# Patient Record
Sex: Male | Born: 1950 | Race: Black or African American | Hispanic: No | Marital: Married | Smoking: Never smoker
Health system: Southern US, Community
[De-identification: ages and names within clinical notes are randomized; demographics above are authoritative.]

## PROBLEM LIST (undated history)

## (undated) DIAGNOSIS — I10 Essential (primary) hypertension: Secondary | ICD-10-CM

## (undated) DIAGNOSIS — R0789 Other chest pain: Secondary | ICD-10-CM

## (undated) DIAGNOSIS — R9431 Abnormal electrocardiogram [ECG] [EKG]: Secondary | ICD-10-CM

## (undated) DIAGNOSIS — R011 Cardiac murmur, unspecified: Secondary | ICD-10-CM

## (undated) DIAGNOSIS — C801 Malignant (primary) neoplasm, unspecified: Secondary | ICD-10-CM

## (undated) DIAGNOSIS — E785 Hyperlipidemia, unspecified: Secondary | ICD-10-CM

## (undated) HISTORY — DX: Malignant (primary) neoplasm, unspecified: C80.1

## (undated) HISTORY — DX: Essential (primary) hypertension: I10

## (undated) HISTORY — DX: Cardiac murmur, unspecified: R01.1

## (undated) HISTORY — PX: EYE SURGERY: SHX253

## (undated) HISTORY — PX: COLONOSCOPY: SHX174

## (undated) HISTORY — PX: OTHER SURGICAL HISTORY: SHX169

## (undated) HISTORY — DX: Hyperlipidemia, unspecified: E78.5

---

## 2005-08-27 ENCOUNTER — Ambulatory Visit: Payer: Self-pay | Admitting: Occupational Therapy

## 2005-11-23 ENCOUNTER — Ambulatory Visit: Payer: Self-pay | Admitting: Nurse Practitioner

## 2006-09-21 DIAGNOSIS — C801 Malignant (primary) neoplasm, unspecified: Secondary | ICD-10-CM

## 2006-09-21 HISTORY — DX: Malignant (primary) neoplasm, unspecified: C80.1

## 2006-09-21 HISTORY — PX: OTHER SURGICAL HISTORY: SHX169

## 2006-09-27 ENCOUNTER — Ambulatory Visit: Payer: Self-pay | Admitting: Urology

## 2006-09-30 ENCOUNTER — Ambulatory Visit: Payer: Self-pay | Admitting: Urology

## 2006-10-18 ENCOUNTER — Ambulatory Visit: Payer: Self-pay | Admitting: Nurse Practitioner

## 2007-05-24 ENCOUNTER — Ambulatory Visit: Payer: Self-pay | Admitting: Unknown Physician Specialty

## 2011-04-22 HISTORY — PX: UMBILICAL HERNIA REPAIR: SHX196

## 2011-04-28 ENCOUNTER — Ambulatory Visit: Payer: Self-pay | Admitting: General Surgery

## 2011-11-12 ENCOUNTER — Ambulatory Visit: Payer: Self-pay | Admitting: General Surgery

## 2011-11-12 LAB — CREATININE, SERUM
Creatinine: 0.97 mg/dL (ref 0.60–1.30)
EGFR (African American): >60
EGFR (Non-African Amer.): >60

## 2011-11-12 LAB — BUN: BUN: 14 mg/dL (ref 7–18)

## 2011-11-18 ENCOUNTER — Ambulatory Visit: Payer: Self-pay | Admitting: General Surgery

## 2011-11-18 HISTORY — PX: HERNIA REPAIR: SHX51

## 2015-01-13 NOTE — Op Note (Signed)
PATIENT NAME:  BARTOW, ZYLSTRA MR#:  [id number] DATE OF BIRTH:  1950/11/01  DATE OF PROCEDURE:  11/18/2011  PREOPERATIVE DIAGNOSIS: Ventral hernia.   POSTOPERATIVE DIAGNOSIS: Ventral hernia.   OPERATIVE PROCEDURE: Repair of ventral hernia with 6.4 cm Proceed ventral patch.   OPERATING SURGEON: Wilford Bellow, MD   ANESTHESIA: General by LMA, Marcaine 0.5% 30 mL local infiltration.   ESTIMATED BLOOD LOSS: Minimal.   CLINICAL NOTE: This 64 year old male had undergone repair of a symptomatic umbilical hernia last year and recently developed a bulge just above the umbilicus. CT scan showed a single 3 cm fascial defect above the previously placed mesh at the umbilical level. He was admitted for elective repair. He received Kefzol prior to the procedure.   OPERATIVE NOTE: With the patient under adequate general anesthesia, the area was prepped with ChloraPrep and draped. Marcaine was infiltrated for postoperative analgesia. A vertical skin incision was made and carried down through the skin and subcutaneous tissue. The hernia sac was identified and opened. It contained only omentum. This was reduced and the hernia sac freed circumferentially. Initial plan was to leave this in place but it was not possible to free it from the area of the mesh at the umbilical area. The sac was subsequently excised. A 6.4 cm Proceed ventral patch was then smoothed into the intraperitoneal position. Multiple transfacial 0 Surgilon sutures were placed approximately 2.5 cm from the edge of the fascial defect through the fascia, through the mesh, and then back to the fascia to anchor it into position. The mesh was then incorporated in the sutures that were used to transversely close the fascial defect. The wound was closed in layers with 2-0 Vicryl to the adipose layer, 3-0 Vicryl to the subcutaneous layer, and a 4-0 Vicryl running subcuticular suture for the skin. Benzoin, Steri-Strips, Telfa, and Tegaderm dressing was then  applied.      The patient tolerated the procedure well and was taken to the recovery room in stable condition.   ____________________________ Siddh Bellow, MD jwb:drc D: 11/18/2011 16:14:49 ET T: 11/18/2011 17:03:12 ET JOB#: [id number]  cc: Bearl Bellow, MD, <Dictator> Eather Colas. Sharlett Iles, FNP-C JEFFREY Amedeo Kinsman MD ELECTRONICALLY SIGNED 11/21/2011 16:37

## 2016-10-05 ENCOUNTER — Other Ambulatory Visit (HOSPITAL_COMMUNITY): Payer: Self-pay | Admitting: Urology

## 2016-10-05 DIAGNOSIS — R972 Elevated prostate specific antigen [PSA]: Secondary | ICD-10-CM

## 2016-10-09 ENCOUNTER — Ambulatory Visit (HOSPITAL_COMMUNITY): Payer: Self-pay

## 2016-10-13 ENCOUNTER — Ambulatory Visit (HOSPITAL_COMMUNITY)
Admission: RE | Admit: 2016-10-13 | Discharge: 2016-10-13 | Disposition: A | Discharge status: Home / Self Care | Payer: Medicare Other | Source: Ambulatory Visit | Attending: Urology | Admitting: Urology

## 2016-10-13 DIAGNOSIS — K4090 Unilateral inguinal hernia, without obstruction or gangrene, not specified as recurrent: Secondary | ICD-10-CM | POA: Diagnosis not present

## 2016-10-13 DIAGNOSIS — K5730 Diverticulosis of large intestine without perforation or abscess without bleeding: Secondary | ICD-10-CM | POA: Insufficient documentation

## 2016-10-13 DIAGNOSIS — R972 Elevated prostate specific antigen [PSA]: Secondary | ICD-10-CM | POA: Insufficient documentation

## 2016-10-13 LAB — POCT I-STAT CREATININE: CREATININE: 1 mg/dL (ref 0.61–1.24)

## 2016-10-13 MED ORDER — GADOBENATE DIMEGLUMINE 529 MG/ML IV SOLN
20.0000 mL | Freq: Once | INTRAVENOUS | Status: AC | PRN
  Administered 2016-10-13: 19 mL via INTRAVENOUS

## 2016-10-13 MED ORDER — LIDOCAINE HCL 2 % EX GEL: 1.0000 "application " | Freq: Once | CUTANEOUS | Status: DC

## 2016-10-13 MED ORDER — LIDOCAINE HCL 2 % EX GEL
CUTANEOUS | Status: AC
  Filled 2016-10-13: qty 30

## 2016-10-19 ENCOUNTER — Encounter: Payer: Self-pay | Admitting: General Surgery

## 2016-10-28 ENCOUNTER — Inpatient Hospital Stay: Admission: RE | Admit: 2016-10-28 | Payer: Medicare Other | Source: Ambulatory Visit

## 2016-10-29 ENCOUNTER — Encounter: Payer: Self-pay | Admitting: General Surgery

## 2016-10-29 ENCOUNTER — Ambulatory Visit (INDEPENDENT_AMBULATORY_CARE_PROVIDER_SITE_OTHER): Payer: Self-pay | Admitting: General Surgery

## 2016-10-29 VITALS — BP 142/90 | HR 75 | Resp 14 | Ht 67.5 in | Wt 199.0 lb

## 2016-10-29 DIAGNOSIS — K4090 Unilateral inguinal hernia, without obstruction or gangrene, not specified as recurrent: Secondary | ICD-10-CM

## 2016-10-29 NOTE — H&P (Signed)
NAMEBOONE, CUEBAS NO.:  [id number]  MEDICAL RECORD NO.:  [id number]  LOCATION:                                 FACILITY:  PHYSICIAN:  Maryan Puls          DATE OF BIRTH:  September 15, 1951  DATE OF ADMISSION: DATE OF DISCHARGE:                            HISTORY AND PHYSICAL   SAME-DAY SURGERY:  November 03, 2016.  CHIEF COMPLAINT:  Rising PSA.  HISTORY OF PRESENT ILLNESS:  Mr. 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  Recently, PSA levels have begun to rise.  He had an ultrasound-guided biopsy December 16, 2015, which was negative for carcinoma.  PSA was noted to be 3.0 ng on September 30, 2016.  He subsequently underwent prostate MRI scan on October 13, 2016, which indicated a small PI-RADS category 3 lesion in the left base lateral peripheral zone.  The patient comes in now for UroNav MRI-guided prostate biopsy.  PAST MEDICAL HISTORY:  ALLERGIES:  NO DRUG ALLERGIES.  CURRENT MEDICATIONS:  Amlodipine and a cholesterol medication.  SURGICAL HISTORY:  Colonoscopy 2003.  SOCIAL HISTORY:  The patient denied tobacco use, consumes alcohol moderately.  FAMILY HISTORY:  The patient has a brother with lymphoma.  Mother died of stomach cancer.  No family history of prostate cancer.  PAST AND CURRENT MEDICAL CONDITIONS: 1. Hypertension. 2. Hypercholesterolemia.  REVIEW OF SYSTEMS:  The patient denies chest pain, shortness of breath, diabetes, stroke, or heart disease.  PHYSICAL EXAMINATION:  GENERAL:  Well-nourished Afro-American male, in no distress. HEENT:  Sclerae were clear. NECK:  Supple.  No palpable cervical adenopathy. LUNGS:  Clear to auscultation. CARDIOVASCULAR:  Regular rhythm and rate. Abdomen:  Soft, nontender abdomen. GU:  Uncircumcised testes, smooth, nontender, 18 mL size each. RECTAL:  Internal hemorrhoids.  No palpable rectal  masses. PROSTATE:  Some vessels not palpable. NEUROMUSCULAR:  Alert and oriented x3.  IMPRESSION: 1. Prostate cancer. 2. Rising PSA. 3. PI-RADS category 3 lesion in the left base lateral peripheral zone.  PLAN:  UroNav MRI-guided prostate biopsies.    ______________________________ Maryan Puls   ______________________________ Maryan Puls    MW/MEDQ  D:  10/29/2016  T:  10/29/2016  Job:  [id number]

## 2016-10-29 NOTE — Patient Instructions (Addendum)
The patient is aware to call back for any questions or concerns.  Inguinal Hernia, Adult Introduction An inguinal hernia is when fat or the intestines push through the area where the leg meets the lower belly (groin) and make a rounded lump (bulge). This condition happens over time. There are three types of inguinal hernias. These types include:  Hernias that can be pushed back into the belly (are reducible).  Hernias that cannot be pushed back into the belly (are incarcerated).  Hernias that cannot be pushed back into the belly and lose their blood supply (get strangulated). This type needs emergency surgery. Follow these instructions at home: Lifestyle  Drink enough fluid to keep your urine (pee) clear or pale yellow.  Eat plenty of fruits, vegetables, and whole grains. These have a lot of fiber. Talk with your doctor if you have questions.  Avoid lifting heavy objects.  Avoid standing for long periods of time.  Do not use tobacco products. These include cigarettes, chewing tobacco, or e-cigarettes. If you need help quitting, ask your doctor.  Try to stay at a healthy weight. General instructions  Do not try to force the hernia back in.  Watch your hernia for any changes in color or size. Let your doctor know if there are any changes.  Take over-the-counter and prescription medicines only as told by your doctor.  Keep all follow-up visits as told by your doctor. This is important. Contact a doctor if:  You have a fever.  You have new symptoms.  Your symptoms get worse. Get help right away if:  The area where the legs meets the lower belly has:  Pain that gets worse suddenly.  A bulge that gets bigger suddenly and does not go down.  A bulge that turns red or purple.  A bulge that is painful to the touch.  You are a man and your scrotum:  Suddenly feels painful.  Suddenly changes in size.  You feel sick to your stomach (nauseous) and this feeling does not go  away.  You throw up (vomit) and this keeps happening.  You feel your heart beating a lot more quickly than normal.  You cannot poop (have a bowel movement) or pass gas. This information is not intended to replace advice given to you by your health care provider. Make sure you discuss any questions you have with your health care provider. Document Released: 10/08/2006 Document Revised: 02/13/2016 Document Reviewed: 07/18/2014  2017 Elsevier

## 2016-10-29 NOTE — Progress Notes (Addendum)
Patient ID: Brett Crawford, male   DOB: 15-Mar-1951, 66 y.o.   MRN: [id number]  Chief Complaint  Patient presents with  . Hernia    HPI Brett Crawford is a 66 y.o. male.  Patient here today for an evaluation of a possible hernia.  Denies pain.  No nausea, vomiting, constipation or diarrhea noted. 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 He has a history of prostate cancer and is followed by Dr Maryan Puls. Hernia was seen on MRI prostate on 10-13-16.  HPI  Past Medical History:  Diagnosis Date  . Cancer [facility]) 2008   prostate  . Hyperlipidemia   . Hypertension   . Murmur     Past Surgical History:  Procedure Laterality Date  . COLONOSCOPY  2011 ?  Marland Kitchen HERNIA REPAIR  11/18/2011   ventral / Dr Bary Castilla  . high intesnty focused ultrasound ( HIFU)  2008  . UMBILICAL HERNIA REPAIR  04/2011   Dr Bary Castilla    Family History  Problem Relation Age of Onset  . Stroke Father     Social History Social History  Substance Use Topics  . Smoking status: Never Smoker  . Smokeless tobacco: Never Used  . Alcohol use Yes     Comment: occasionally    No Known Allergies  Current Outpatient Prescriptions  Medication Sig Dispense Refill  . amLODipine (NORVASC) 10 MG tablet Take 10 mg by mouth daily.    . finasteride (PROSCAR) 5 MG tablet Take 5 mg by mouth daily.    . naproxen sodium (ANAPROX) 220 MG tablet Take 440 mg by mouth daily as needed (pain).    . simvastatin (ZOCOR) 20 MG tablet Take 20 mg by mouth daily.     No current facility-administered medications for this visit.     Review of Systems Review of Systems  Constitutional: Negative.   Respiratory: Negative.   Cardiovascular: Negative.   Gastrointestinal: Negative for abdominal pain, constipation and diarrhea.    Blood pressure (!) 142/90, pulse 75, resp. rate 14, height 5' 7.5" (1.715 m), weight 199 lb (90.3 kg), SpO2 98 %.  Physical Exam Physical Exam  Constitutional: He is oriented to person, place, and time. He  appears well-developed and well-nourished.  HENT:  Mouth/Throat: Oropharynx is clear and moist.  Eyes: Conjunctivae are normal. No scleral icterus.  Neck: Neck supple.  Cardiovascular: Normal rate and regular rhythm.   Murmur heard. Pulmonary/Chest: Effort normal and breath sounds normal.  Abdominal: Soft. Normal appearance. A hernia is present. Hernia confirmed positive in the right inguinal area.    Lymphadenopathy:    He has no cervical adenopathy.  Neurological: He is alert and oriented to person, place, and time.  Skin: Skin is warm and dry.  Psychiatric: His behavior is normal.    Data Reviewed Prostate MRI dated 10/14/2016 reviewed. Indirect right inguinal hernia with adipose tissue.  Review of the 11/12/2011 CT scan obtained prior to repair of his epigastric/ventral hernia reported a 2.3 cm nodule of indeterminate origin in the right adrenal gland. We'll clarify if the patient has had any further follow-up in regards to this lesion and if not, arrange for MRI.  Assessment    Asymptomatic right inguinal hernia.    Plan    The patient has many activities plan for the spring, including the marriage of his daughter coming up in April. He is totally asymptomatic. Observation is reasonable at this time, with elective repair if he becomes symptomatic or once to be more proactive.  Hernia precautions and incarceration were discussed with the patient. If they develop symptoms of an incarcerated hernia, they were encouraged to seek prompt medical attention.  I review the options for observation versus prophylactic repair, and the literature showing either option is safe. If elective repair is undertaken prosthetic mesh would be utilized. The risk of infection was reviewed. The role of prosthetic mesh to minimize the risk of recurrence was reviewed.  Proper lifting techniques reviewed. The patient is aware to call back for any questions or concerns.   This information has been  scribed by Karie Fetch RN, BSN,BC.  Brett Crawford 10/29/2016, 12:18 PM  I spoke with the patient after he returned home, and he is not aware of any additional imaging studies done at other institutions to follow-up the suspected right adrenal lesion. I recommended an MRI to evaluate this. He'll consider this recommendation, and notify the office of how he would like to proceed.

## 2016-10-30 ENCOUNTER — Encounter
Admission: RE | Admit: 2016-10-30 | Discharge: 2016-10-30 | Disposition: A | Discharge status: Home / Self Care | Payer: Medicare Other | Source: Ambulatory Visit | Attending: Urology | Admitting: Urology

## 2016-10-30 NOTE — Patient Instructions (Signed)
  Your procedure is scheduled on: 11-03-16 Report to Same Day Surgery 2nd floor medical mall [facility] Entrance-take elevator on left to 2nd floor.  Check in with surgery information desk.) To find out your arrival time please call [phone] between 1PM - 3PM on 11-02-16  Remember: Instructions that are not followed completely may result in serious medical risk, up to and including death, or upon the discretion of your surgeon and anesthesiologist your surgery may need to be rescheduled.    _x___ 1. Do not eat food or drink liquids after midnight. No gum chewing or hard candies.     __x__ 2. No Alcohol for 24 hours before or after surgery.   __x__3. No Smoking for 24 prior to surgery.   ____  4. Bring all medications with you on the day of surgery if instructed.    __x__ 5. Notify your doctor if there is any change in your medical condition     (cold, fever, infections).     Do not wear jewelry, make-up, hairpins, clips or nail polish.  Do not wear lotions, powders, or perfumes. You may wear deodorant.  Do not shave 48 hours prior to surgery. Men may shave face and neck.  Do not bring valuables to the hospital.    [facility] is not responsible for any belongings or valuables.               Contacts, dentures or bridgework may not be worn into surgery.  Leave your suitcase in the car. After surgery it may be brought to your room.  For patients admitted to the hospital, discharge time is determined by your treatment team.   Patients discharged the day of surgery will not be allowed to drive home.  You will need someone to drive you home and stay with you the night of your procedure.    Please read over the following fact sheets that you were given:   [facility] Preparing for Surgery and or MRSA Information   _x___ Take these medicines the morning of surgery with A SIP OF WATER:    1. AMLODIPINE  2. FINASTERIDE  3. SIMVASTATIN  4.  5.  6.  ____Fleets enema or Magnesium  Citrate as directed.   ____ Use CHG Soap or sage wipes as directed on instruction sheet   ____ Use inhalers on the day of surgery and bring to hospital day of surgery  ____ Stop metformin 2 days prior to surgery    ____ Take 1/2 of usual insulin dose the night before surgery and none on the morning of surgery.   ____ Stop Aspirin, Coumadin, Pllavix ,Eliquis, Effient, or Pradaxa  x__ Stop Anti-inflammatories such as Advil, Aleve, Ibuprofen, Motrin, Naproxen,          Naprosyn, Goodies powders or aspirin products NOW- Ok to take Tylenol.   ____ Stop supplements until after surgery.    ____ Bring C-Pap to the hospital.

## 2016-11-03 ENCOUNTER — Encounter: Payer: Self-pay | Admitting: *Deleted

## 2016-11-03 ENCOUNTER — Ambulatory Visit
Admission: RE | Admit: 2016-11-03 | Discharge: 2016-11-03 | Disposition: A | Discharge status: Home / Self Care | Payer: Medicare Other | Source: Ambulatory Visit | Attending: Urology | Admitting: Urology

## 2016-11-03 ENCOUNTER — Ambulatory Visit: Payer: Medicare Other | Admitting: Anesthesiology

## 2016-11-03 ENCOUNTER — Encounter
Admission: RE | Disposition: A | Discharge status: Home / Self Care | Payer: Self-pay | Source: Ambulatory Visit | Attending: Urology

## 2016-11-03 DIAGNOSIS — C61 Malignant neoplasm of prostate: Secondary | ICD-10-CM | POA: Diagnosis present

## 2016-11-03 DIAGNOSIS — R9721 Rising PSA following treatment for malignant neoplasm of prostate: Secondary | ICD-10-CM | POA: Insufficient documentation

## 2016-11-03 DIAGNOSIS — E7800 Pure hypercholesterolemia, unspecified: Secondary | ICD-10-CM | POA: Insufficient documentation

## 2016-11-03 DIAGNOSIS — I10 Essential (primary) hypertension: Secondary | ICD-10-CM | POA: Insufficient documentation

## 2016-11-03 HISTORY — PX: PROSTATE BIOPSY: SHX241

## 2016-11-03 SURGERY — BIOPSY, PROSTATE
Anesthesia: General

## 2016-11-03 MED ORDER — SODIUM CHLORIDE 0.9 % IV SOLN
INTRAVENOUS | Status: DC
  Administered 2016-11-03 (×2): via INTRAVENOUS

## 2016-11-03 MED ORDER — FENTANYL CITRATE (PF) 100 MCG/2ML IJ SOLN
INTRAMUSCULAR | Status: DC | PRN
  Administered 2016-11-03: 100 ug via INTRAVENOUS

## 2016-11-03 MED ORDER — SUCCINYLCHOLINE CHLORIDE 20 MG/ML IJ SOLN
INTRAMUSCULAR | Status: DC | PRN
  Administered 2016-11-03: 100 mg via INTRAVENOUS

## 2016-11-03 MED ORDER — DEXAMETHASONE SODIUM PHOSPHATE 10 MG/ML IJ SOLN
INTRAMUSCULAR | Status: DC | PRN
  Administered 2016-11-03: 10 mg via INTRAVENOUS

## 2016-11-03 MED ORDER — DEXTROSE 5 % IV SOLN
INTRAVENOUS | Status: DC | PRN
  Administered 2016-11-03: 1 g via INTRAVENOUS

## 2016-11-03 MED ORDER — FAMOTIDINE 20 MG PO TABS
20.0000 mg | ORAL_TABLET | Freq: Once | ORAL | Status: AC
  Administered 2016-11-03: 20 mg via ORAL

## 2016-11-03 MED ORDER — LIDOCAINE HCL 2 % EX GEL
CUTANEOUS | Status: AC
  Filled 2016-11-03: qty 10

## 2016-11-03 MED ORDER — PROPOFOL 10 MG/ML IV BOLUS
INTRAVENOUS | Status: DC | PRN
  Administered 2016-11-03: 200 mg via INTRAVENOUS

## 2016-11-03 MED ORDER — ROCURONIUM BROMIDE 100 MG/10ML IV SOLN
INTRAVENOUS | Status: DC | PRN
  Administered 2016-11-03: 5 mg via INTRAVENOUS

## 2016-11-03 MED ORDER — LEVOFLOXACIN 500 MG PO TABS: 500.0000 mg | ORAL_TABLET | Freq: Every day | ORAL | 0 refills | Status: DC

## 2016-11-03 MED ORDER — ESMOLOL HCL 100 MG/10ML IV SOLN
INTRAVENOUS | Status: DC | PRN
  Administered 2016-11-03: 30 mg via INTRAVENOUS

## 2016-11-03 MED ORDER — GENTAMICIN IN SALINE 1.6-0.9 MG/ML-% IV SOLN
80.0000 mg | Freq: Once | INTRAVENOUS | Status: AC
  Administered 2016-11-03: 80 mg via INTRAVENOUS
  Filled 2016-11-03: qty 50

## 2016-11-03 MED ORDER — LIDOCAINE 2% (20 MG/ML) 5 ML SYRINGE
INTRAMUSCULAR | Status: DC | PRN
  Administered 2016-11-03: 100 mg via INTRAVENOUS

## 2016-11-03 MED ORDER — DEXTROSE 5 % IV SOLN
1.0000 g | Freq: Once | INTRAVENOUS | Status: DC
  Filled 2016-11-03: qty 10

## 2016-11-03 MED ORDER — FENTANYL CITRATE (PF) 100 MCG/2ML IJ SOLN: 25.0000 ug | INTRAMUSCULAR | Status: DC | PRN

## 2016-11-03 MED ORDER — NUCYNTA 50 MG PO TABS: 50.0000 mg | ORAL_TABLET | Freq: Four times a day (QID) | ORAL | 0 refills | Status: DC | PRN

## 2016-11-03 MED ORDER — MIDAZOLAM HCL 5 MG/5ML IJ SOLN
INTRAMUSCULAR | Status: DC | PRN
  Administered 2016-11-03: 2 mg via INTRAVENOUS

## 2016-11-03 MED ORDER — MIDAZOLAM HCL 2 MG/2ML IJ SOLN
INTRAMUSCULAR | Status: AC
  Filled 2016-11-03: qty 2

## 2016-11-03 MED ORDER — FLEET ENEMA 7-19 GM/118ML RE ENEM: 1.0000 | ENEMA | Freq: Once | RECTAL | Status: DC

## 2016-11-03 MED ORDER — FAMOTIDINE 20 MG PO TABS
ORAL_TABLET | ORAL | Status: AC
  Filled 2016-11-03: qty 1

## 2016-11-03 MED ORDER — ONDANSETRON HCL 4 MG/2ML IJ SOLN: 4.0000 mg | Freq: Once | INTRAMUSCULAR | Status: DC | PRN

## 2016-11-03 MED ORDER — ONDANSETRON HCL 4 MG/2ML IJ SOLN
INTRAMUSCULAR | Status: DC | PRN
  Administered 2016-11-03: 4 mg via INTRAVENOUS

## 2016-11-03 MED ORDER — FENTANYL CITRATE (PF) 100 MCG/2ML IJ SOLN
INTRAMUSCULAR | Status: AC
  Filled 2016-11-03: qty 2

## 2016-11-03 MED ORDER — PROPOFOL 500 MG/50ML IV EMUL
INTRAVENOUS | Status: AC
  Filled 2016-11-03: qty 50

## 2016-11-03 MED ORDER — GLYCOPYRROLATE 0.2 MG/ML IJ SOLN
INTRAMUSCULAR | Status: AC
  Filled 2016-11-03: qty 1

## 2016-11-03 MED ORDER — GENTAMICIN SULFATE 40 MG/ML IJ SOLN
80.0000 mg | Freq: Once | INTRAVENOUS | Status: DC
  Filled 2016-11-03: qty 2

## 2016-11-03 SURGICAL SUPPLY — 27 items
BAG URO DRAIN 2000ML W/SPOUT (MISCELLANEOUS) ×3 IMPLANT
BIPLANE BIOPSY GUIDE ×2 IMPLANT
BLADE CLIPPER SURG (BLADE) IMPLANT
DRAPE SHEET LG 3/4 BI-LAMINATE (DRAPES) ×3 IMPLANT
DRESSING TELFA 4X3 1S ST N-ADH (GAUZE/BANDAGES/DRESSINGS) ×3 IMPLANT
DRSG TELFA 3X8 NADH (GAUZE/BANDAGES/DRESSINGS) ×3 IMPLANT
GAUZE SPONGE 4X4 12PLY STRL (GAUZE/BANDAGES/DRESSINGS) ×3 IMPLANT
GLOVE BIO SURGEON STRL SZ 6.5 (GLOVE) ×2 IMPLANT
GLOVE BIO SURGEON STRL SZ7 (GLOVE) ×6 IMPLANT
GLOVE BIO SURGEONS STRL SZ 6.5 (GLOVE) ×1
GUIDE NEEDLE ENDOCAV 16-18 CVR (NEEDLE) ×3 IMPLANT
INST BIOPSY MAXCORE 18GX25 (NEEDLE) ×3 IMPLANT
KIT RM TURNOVER CYSTO AR (KITS) ×3 IMPLANT
NDL DEFLUX 3.7X23X350 (MISCELLANEOUS) ×3
NDL SAFETY 18GX1.5 (NEEDLE) ×3 IMPLANT
NDL SAFETY 22GX1.5 (NEEDLE) ×3 IMPLANT
NEEDLE BIO TRUPATH DISP 18GX25 (MISCELLANEOUS) ×2 IMPLANT
NEEDLE DEFLUX 3.7X23X350 (MISCELLANEOUS) ×1 IMPLANT
NEEDLE GUIDE BIOPSY 644068 (NEEDLE) ×3 IMPLANT
PROBE ALOKA ALPHA6 ROB DROP IN (MISCELLANEOUS) ×2 IMPLANT
PROBE URONAV BK 8808E 8818 HLD (MISCELLANEOUS) ×1 IMPLANT
SYRINGE 10CC LL (SYRINGE) ×3 IMPLANT
SYRINGE IRR TOOMEY STRL 70CC (SYRINGE) ×3 IMPLANT
URONAV BK 8808E 8818 PROBE HLD (MISCELLANEOUS) ×3
URONAV MRI FUSION TWO PATIENTS (MISCELLANEOUS) ×2 IMPLANT
URONAV ULTRASOUND (MISCELLANEOUS) ×2 IMPLANT
WATER STERILE IRR 1000ML POUR (IV SOLUTION) ×3 IMPLANT

## 2016-11-03 NOTE — Discharge Instructions (Addendum)
AMBULATORY SURGERY  DISCHARGE INSTRUCTIONS   1) The drugs that you were given will stay in your system until tomorrow so for the next 24 hours you should not:  A) Drive an automobile B) Make any legal decisions C) Drink any alcoholic beverage   2) You may resume regular meals tomorrow.  Today it is better to start with liquids and gradually work up to solid foods.  You may eat anything you prefer, but it is better to start with liquids, then soup and crackers, and gradually work up to solid foods.   3) Please notify your doctor immediately if you have any unusual bleeding, trouble breathing, redness and pain at the surgery site, drainage, fever, or pain not relieved by medication. 4)   5) Your post-operative visit with Dr.                                     is: Date:                        Time:    Please call to schedule your post-operative visit.  6) Additional Instructions:      Transrectal Ultrasound-Guided Biopsy A transrectal ultrasound-guided biopsy is a procedure to remove samples of tissue from your prostate using ultrasound images to guide the procedure. The procedure is usually done to evaluate the prostate gland of men who have an elevated prostate-specific antigen (PSA). PSA is a blood test to screen for prostate cancer. The biopsy samples are taken to check for prostate cancer.  LET [facility] CARE PROVIDER KNOW ABOUT:  Any allergies you have.  All medicines you are taking, including vitamins, herbs, eye drops, creams, and over-the-counter medicines.  Previous problems you or members of your family have had with the use of anesthetics.  Any blood disorders you have.  Previous surgeries you have had.  Medical conditions you have. RISKS AND COMPLICATIONS Generally, this is a safe procedure. However, as with any procedure, problems can occur. Possible problems include:  Infection of your prostate.  Bleeding from your rectum or blood in your  urine.  Difficulty urinating.  Nerve damage (this is usually temporary).  Damage to surrounding structures such as blood vessels, organs, and muscles, which would require other procedures. BEFORE THE PROCEDURE  Do not  eat or drink anything after midnight on the night before the procedure or as directed by your health care provider.  Take medicines only as directed by your health care provider.  Your health care provider may have you stop taking certain medicines 5-7 days before the procedure.  You will be given an enema before the procedure. During an enema, a liquid is injected into your rectum to clear out waste.  You may have lab tests the day of your procedure.   Plan to have someone take you home after the procedure. PROCEDURE   You will be given medicine to help you relax (sedative) before the procedure. An IV tube will be inserted into one of your veins and used to give fluids and medicine.  You will be given antibiotic medicine to reduce the risk of an infection.  You will be placed on your side for the procedure.  A probe with lubricated gel will be placed into your rectum, and images will be taken of your prostate and surrounding structures.  Numbing medicine will be injected into the prostate before the  biopsy samples are taken.  A biopsy needle will then be inserted and guided to your prostate with the use of the ultrasound images.  Samples of prostate tissue will be taken, and the needle will then be removed.  The biopsy samples will be sent to a lab to be analyzed. Results are usually back in 2-3 days. AFTER THE PROCEDURE  You will be taken to a recovery area where you will be monitored.  You may have some discomfort in the rectal area. You will be given pain medicines to control this.  You may be allowed to go home the same day, or you may need to stay in the hospital overnight. This information is not intended to replace advice given to you by your health  care provider. Make sure you discuss any questions you have with your health care provider. Document Released: 01/22/2014 Document Revised: 09/28/2014 Document Reviewed: 01/22/2014 Elsevier Interactive Patient Education  2017 [place] Antigen Test Why am I having this test? The prostate-specific antigen (PSA) test is performed to determine how much PSA you have in your blood. PSA is a type of protein that is normally present in the prostate gland. Certain conditions can cause PSA blood levels to increase, such as:  Infection in the prostate (prostatitis).  Enlargement of the prostate (hypertrophy).  Prostate cancer. Because PSA levels increase greatly from prostate cancer, this test can be used to confirm a diagnosis of prostate cancer. It may also be used to monitor treatment for prostate cancer and to watch for a return of prostate cancer after treatment has finished. This test has a very high false-positive rate. Therefore, routine PSA screening for all men is no longer recommended. A false-positive result is incorrect because it indicates a condition or finding is present when it is not. What kind of sample is taken? A blood sample is required for this test. It is usually collected by inserting a needle into a vein or by sticking a finger with a small needle. How do I prepare for this test? There is no preparation required for this test. However, there are factors that can affect the results of a PSA test. To get the most accurate results:  Avoid having a rectal exam within several hours before having your blood drawn for this test.  Avoid having any procedures performed on the prostate gland within 6 weeks of having this test.  Avoid ejaculating within 24 hours of having this test.  Tell your health care provider if you had a recent urinary tract infection (UTI).  Tell your health care provider if you are taking medicines to assist with hair growth, such as  finasteride.  Tell your health care provider if you have been exposed to a medicine called diethylstilbestrol. Let your health care provider know if any of these factors apply to you. You may be asked to reschedule the test. What are the reference ranges? Reference ranges are established after testing a large group of people. Reference ranges may vary among different people, labs, and hospitals. It is your responsibility to obtain your test results. Ask the lab or department performing the test when and how you will get your results.  Low: 0-2.5 ng/mL.  Slightly to moderately elevated: 2.6-10.0 ng/mL.  Moderately elevated: 10.0-19.9 ng/mL.  Significantly elevated: 20 ng/mL or greater. What do the results mean? PSA test results greater than 4 ng/mL are found in the majority of men with prostate cancer. If your test result is above this level, this  can indicate an increased risk for prostate cancer. Increased PSA levels can also indicate other health conditions. Talk with your health care provider to discuss your results, treatment options, and if necessary, the need for more tests. Talk with your health care provider if you have any questions about your results. Talk with your health care provider to discuss your results, treatment options, and if necessary, the need for more tests. Talk with your health care provider if you have any questions about your results. This information is not intended to replace advice given to you by your health care provider. Make sure you discuss any questions you have with your health care provider. Document Released: 10/10/2004 Document Revised: 05/13/2016 Document Reviewed: 01/31/2014 Elsevier Interactive Patient Education  2017 [place] prostate is a walnut-sized gland that is involved in the production of semen. It is located below a man's bladder, in front of the rectum. Prostate cancer is the abnormal growth of cells in the prostate  gland. What are the causes? The exact cause of this condition is not known. What increases the risk? This condition is more likely to develop in men who:  Are older than age 31.  Are African-American.  Are obese.  Have a family history of prostate cancer.  Have a family history of breast cancer. What are the signs or symptoms? Symptoms of this condition include:  A need to urinate often.  Weak or interrupted flow of urine.  Trouble starting or stopping urination.  Inability to urinate.  Pain or burning during urination.  Painful ejaculation.  Blood in urine or semen.  Persistent pain or discomfort in the lower back, lower abdomen, hips, or upper thighs.  Trouble getting an erection.  Trouble emptying the bladder all the way. How is this diagnosed? This condition can be diagnosed with:  A digital rectal exam. For this exam, a health care provider inserts a gloved finger into the rectum to feel the prostate gland.  A blood test called a prostate-specific antigen (PSA) test.  An imaging test called transrectal ultrasonography.  A procedure in which a sample of tissue is taken from the prostate and examined under a microscope (prostate biopsy). Once the condition is diagnosed, tests will be done to determine how far the cancer has spread. This is called staging the cancer. Staging may involve imaging tests, such as:  A bone scan.  A CT scan.  A PET scan.  An MRI. The stages of prostate cancer are as follows:  Stage I. At this stage, the cancer is found in the prostate only. The cancer is not visible on imaging tests and it is usually found by accident, such as during a prostate surgery.  Stage II. At this stage, the cancer is more advanced than it is in stage I, but the cancer has not spread outside the prostate.  Stage III. At this stage, the cancer has spread beyond the outer layer of the prostate to nearby tissues. The cancer may be found in the seminal  vesicles, which are near the bladder and the prostate.  Stage IV. At this stage, the cancer has spread other parts of the body, such as the lymph nodes, bones, bladder, rectum, liver, or lungs. How is this treated? Treatment for this condition depends on several factors, including the stage of the cancer, your age, personal preferences, and your overall health. Talk with your health care provider about treatment options that are recommended for you. Common treatments include:  Observation for  early stage prostate cancer (active surveillance). This involves having exams, blood tests, and in some cases, more biopsies. For some men, this is the only treatment needed.  Surgery. Types of surgeries include:  Open surgery. In this surgery, a larger incision is made to remove the prostate.  A laparoscopic prostatectomy. This is a surgery to remove the prostate and lymph nodes through several, small incisions. It is often referred to as a minimally invasive surgery.  A robotic prostatectomy. This is a surgery to remove the prostate and lymph nodes with the help of a robotic arm that is controlled by a computer.  Orchiectomy. This is a surgery to remove the testicles.  Cryosurgery. This is a surgery to freeze and destroy cancer cells.  Radiation treatment. Types of radiation treatment include:  External beam radiation. This type aims beams of radiation from outside the body at the prostate to destroy cancerous cells.  Brachytherapy. This type uses radioactive needles, seeds, wires, or tubes that are implanted into the prostate gland. Like external beam radiation, brachytherapy destroys cancerous cells. An advantage is that this type of radiation limits the damage to surrounding tissue and has fewer side effects.  High-intensity, focused ultrasonography. This treatment destroys cancer cells by delivering high-energy ultrasound waves to the cancerous cells.  Chemotherapy medicines. This treatment  kills cancer cells or stops them from multiplying.  Hormone treatment. This treatment involves taking medicines that act on one of the male hormones (testosterone):  By stopping your body from producing testosterone.  By blocking testosterone from reaching cancer cells. Follow these instructions at home:  Take over-the-counter and prescription medicines only as told by your health care provider.  Maintain a healthy diet.  Get plenty of sleep.  Consider joining a support group for men who have prostate cancer. Meeting with a support group may help you learn to cope with the stress of having cancer.  Keep all follow-up visits as told by your health care provider. This is important.  If you have to go to the hospital, notify your cancer specialist (oncologist).  Treatment for prostate cancer may affect sexual function. Continue to have intimate moments with your partner. This may include touching, holding, hugging, and caressing. Contact a health care provider if:  You have trouble urinating.  You have blood in your urine.  You have pain in your hips, back, or chest. Get help right away if:  You have weakness or numbness in your legs.  You have cannot control urination or your bowel movements (incontinence).  You have trouble breathing.  You have sudden chest pain.  You have chills or a fever. Summary  The prostate is a walnut-sized gland that is involved in the production of semen. It is located below a man's bladder, in front of the rectum. Prostate cancer is the abnormal growth of cells in the prostate gland.  Treatment for this condition depends on several factors, including the stage of the cancer, your age, personal preferences, and your overall health. Talk with your health care provider about treatment options that are recommended for you.  Consider joining a support group for men who have prostate cancer. Meeting with a support group may help you learn to cope with  the stress of having cancer. This information is not intended to replace advice given to you by your health care provider. Make sure you discuss any questions you have with your health care provider. Document Released: 09/07/2005 Document Revised: 05/19/2016 Document Reviewed: 05/18/2016 Elsevier Interactive Patient Education  2017  Elsevier Inc[employer] ° °

## 2016-11-03 NOTE — Anesthesia Post-op Follow-up Note (Cosign Needed)
Anesthesia QCDR form completed.        

## 2016-11-03 NOTE — Op Note (Signed)
Preoperative diagnosis: 1. Rising PSA                                            2. Prostate cancer  Postoperative diagnosis: Same   Procedure: UroNAV guided transrectal fusion prostate biopsy    Surgeon: Otelia Limes. Yves Dill MD  Anesthesia: General  Indications:See the history and physical. After informed consent the above procedure(s) were requested     Technique and findings: After adequate general anesthesia been obtained the patient was placed into the lateral decubitus position. Finger sweep performed and rectal vault was free of debris. The transrectal ultrasound probe was then positioned and the prostate gland imaged in sagittal and transverse planes. The ultrasound images were then superimposed to the MRI images with the uroNav device. The region of interest in the peripheral left base was identified and 2 core biopsies obtained. At this point, standard sextant core biopsies were obtained using ultrasound guidance alone. The procedure was then terminated and patient transferred to the recovery room in stable condition.

## 2016-11-03 NOTE — H&P (Signed)
Date of Initial H&P: 10/29/16 History reviewed, patient examined, no change in status, stable for surgery.

## 2016-11-03 NOTE — Anesthesia Procedure Notes (Signed)
Procedure Name: Intubation Date/Time: 11/03/2016 1:13 PM Performed by: Marsh Dolly Pre-anesthesia Checklist: Patient identified, Patient being monitored, Timeout performed, Emergency Drugs available and Suction available Patient Re-evaluated:Patient Re-evaluated prior to inductionOxygen Delivery Method: Circle system utilized Preoxygenation: Pre-oxygenation with 100% oxygen Intubation Type: IV induction Ventilation: Mask ventilation without difficulty Laryngoscope Size: Mac and 3 Grade View: Grade I Tube type: Oral Tube size: 7.5 mm Number of attempts: 1 Placement Confirmation: ETT inserted through vocal cords under direct vision,  positive ETCO2 and breath sounds checked- equal and bilateral Secured at: 21 cm Tube secured with: Tape Dental Injury: Teeth and Oropharynx as per pre-operative assessment

## 2016-11-03 NOTE — Transfer of Care (Signed)
Immediate Anesthesia Transfer of Care Note  Patient: Brett Crawford  Procedure(s) Performed: Procedure(s): PROSTATE BIOPSY (N/A)  Patient Location: PACU  Anesthesia Type:General  Level of Consciousness: awake, alert  and oriented  Airway & Oxygen Therapy: Patient Spontanous Breathing and Patient connected to face mask oxygen  Post-op Assessment: Report given to RN and Post -op Vital signs reviewed and stable  Post vital signs: Reviewed and stable  Last Vitals:  Vitals:   11/03/16 1207 11/03/16 1356  BP: 129/82 (!) 134/91  Pulse: 65 76  Resp: 18 20  Temp: 36.8 C 36.2 C    Last Pain:  Vitals:   11/03/16 1207  TempSrc: Oral         Complications: No apparent anesthesia complications

## 2016-11-03 NOTE — Anesthesia Preprocedure Evaluation (Signed)
Anesthesia Evaluation  Patient identified by MRN, date of birth, ID band Patient awake    Reviewed: Allergy & Precautions  Airway Mallampati: II       Dental  (+) Teeth Intact   Pulmonary neg pulmonary ROS,    breath sounds clear to auscultation       Cardiovascular hypertension, Pt. on home beta blockers  Rhythm:Regular Rate:Normal     Neuro/Psych    GI/Hepatic negative GI ROS, Neg liver ROS,   Endo/Other  negative endocrine ROS  Renal/GU negative Renal ROS     Musculoskeletal   Abdominal Normal abdominal exam  (+)   Peds  Hematology negative hematology ROS (+)   Anesthesia Other Findings   Reproductive/Obstetrics                             Anesthesia Physical Anesthesia Plan  ASA: II  Anesthesia Plan: General   Post-op Pain Management:    Induction: Intravenous  Airway Management Planned: Oral ETT  Additional Equipment:   Intra-op Plan:   Post-operative Plan: Extubation in OR  Informed Consent: I have reviewed the patients History and Physical, chart, labs and discussed the procedure including the risks, benefits and alternatives for the proposed anesthesia with the patient or authorized representative who has indicated his/her understanding and acceptance.     Plan Discussed with: CRNA and Surgeon  Anesthesia Plan Comments:         Anesthesia Quick Evaluation

## 2016-11-04 ENCOUNTER — Encounter: Payer: Self-pay | Admitting: Urology

## 2016-11-04 NOTE — Addendum Note (Signed)
Encounter addended by: Nancie Neas Braya Habermehl on: 11/04/2016 11:32 AM<BR>    Actions taken: Imaging Exam ended, Charge Capture section accepted

## 2016-11-05 ENCOUNTER — Telehealth: Payer: Self-pay

## 2016-11-05 ENCOUNTER — Other Ambulatory Visit: Payer: Self-pay

## 2016-11-05 DIAGNOSIS — E279 Disorder of adrenal gland, unspecified: Principal | ICD-10-CM

## 2016-11-05 DIAGNOSIS — E278 Other specified disorders of adrenal gland: Secondary | ICD-10-CM

## 2016-11-05 LAB — SURGICAL PATHOLOGY

## 2016-11-05 NOTE — Telephone Encounter (Signed)
-----   Message from Montez Bellow, MD sent at 11/04/2016  1:26 PM EST ----- Regarding: RE: MRI Renal/ adrenal study: F/U adrenal nodule.  ----- Message ----- From: Lesly Rubenstein, LPN Sent: [id number]  11:22 AM To: Artice Bellow, MD Subject: MRI                                            Patient called and would like to set up the MRI as discussed at his last visit. Just need to know what you want ordered as far as the MRI, and diagnosis. Also do you want a follow up visit for the patient afterward ?

## 2016-11-05 NOTE — Telephone Encounter (Signed)
The patient is scheduled for an MRI Abdomen w/wo contrast at [facility] on 11/14/16 at 11:00 am. He is to arrive by 10:45 am and have nothing to eat or drink for 4 hours prior. The patient is aware of date, time, and instructions.

## 2016-11-09 ENCOUNTER — Encounter: Payer: Self-pay | Admitting: Urology

## 2016-11-14 ENCOUNTER — Ambulatory Visit
Admission: RE | Admit: 2016-11-14 | Discharge: 2016-11-14 | Disposition: A | Discharge status: Home / Self Care | Payer: Medicare Other | Source: Ambulatory Visit | Attending: General Surgery | Admitting: General Surgery

## 2016-11-14 DIAGNOSIS — N281 Cyst of kidney, acquired: Secondary | ICD-10-CM | POA: Diagnosis not present

## 2016-11-14 DIAGNOSIS — E279 Disorder of adrenal gland, unspecified: Secondary | ICD-10-CM | POA: Diagnosis present

## 2016-11-14 DIAGNOSIS — E278 Other specified disorders of adrenal gland: Secondary | ICD-10-CM

## 2016-11-14 DIAGNOSIS — K449 Diaphragmatic hernia without obstruction or gangrene: Secondary | ICD-10-CM | POA: Diagnosis not present

## 2016-11-14 MED ORDER — GADOBENATE DIMEGLUMINE 529 MG/ML IV SOLN
18.0000 mL | Freq: Once | INTRAVENOUS | Status: AC | PRN
  Administered 2016-11-14: 18 mL via INTRAVENOUS

## 2016-11-17 ENCOUNTER — Telehealth: Payer: Self-pay

## 2016-11-17 NOTE — Telephone Encounter (Signed)
Patient notified that he should receive a call back from Dr Bary Castilla regarding his MRI results tomorrow.

## 2016-11-19 ENCOUNTER — Telehealth: Payer: Self-pay

## 2016-11-19 NOTE — Telephone Encounter (Signed)
-----   Message from Foch Bellow, MD sent at 11/18/2016  8:40 PM EST ----- Please notify the patient the MRI was fine. No adrenal nodule. No additional testing needed.    ----- Message ----- From: Interface, Rad Results In Sent: 11/14/2016  12:14 PM To: Daren Bellow, MD

## 2016-11-19 NOTE — Telephone Encounter (Signed)
Notified patient as instructed, patient pleased. Discussed follow-up appointments, patient agrees. Will call with any problems with his hernia.

## 2017-02-02 ENCOUNTER — Ambulatory Visit: Payer: Medicare Other | Admitting: General Surgery

## 2017-02-02 ENCOUNTER — Ambulatory Visit (INDEPENDENT_AMBULATORY_CARE_PROVIDER_SITE_OTHER): Payer: Medicare Other | Admitting: General Surgery

## 2017-02-02 ENCOUNTER — Encounter: Payer: Self-pay | Admitting: General Surgery

## 2017-02-02 VITALS — BP 130/80 | HR 74 | Resp 14 | Ht 67.0 in | Wt 196.0 lb

## 2017-02-02 DIAGNOSIS — K4090 Unilateral inguinal hernia, without obstruction or gangrene, not specified as recurrent: Secondary | ICD-10-CM | POA: Diagnosis not present

## 2017-02-02 NOTE — Patient Instructions (Addendum)
Inguinal Hernia, Adult An inguinal hernia is when fat or the intestines push through the area where the leg meets the lower abdomen (groin) and create a rounded lump (bulge). This condition develops over time. There are three types of inguinal hernias. These types include:  Hernias that can be pushed back into the belly (are reducible).  Hernias that are not reducible (are incarcerated).  Hernias that are not reducible and lose their blood supply (are strangulated). This type of hernia requires emergency surgery. What are the causes? This condition is caused by having a weak spot in the muscles or tissue. This weakness lets the hernia poke through. This condition can be triggered by:  Suddenly straining the muscles of the lower abdomen.  Lifting heavy objects.  Straining to have a bowel movement. Difficult bowel movements (constipation) can lead to this.  Coughing. What increases the risk? This condition is more likely to develop in:  Men.  Pregnant women.  People who:  Are overweight.  Work in jobs that require long periods of standing or heavy lifting.  Have had an inguinal hernia before.  Smoke or have lung disease. These factors can lead to long-lasting (chronic) coughing. What are the signs or symptoms? Symptoms can depend on the size of the hernia. Often, a small inguinal hernia has no symptoms. Symptoms of a larger hernia include:  A lump in the groin. This is easier to see when the person is standing. It might not be visible when he or she is lying down.  Pain or burning in the groin. This occurs especially when lifting, straining, or coughing.  A dull ache or a feeling of pressure in the groin.  A lump in the scrotum in men. Symptoms of a strangulated inguinal hernia can include:  A bulge in the groin that is very painful and tender to the touch.  A bulge that turns red or purple.  Fever, nausea, and vomiting.  The inability to have a bowel movement or to  pass gas. How is this diagnosed? This condition is diagnosed with a medical history and physical exam. Your health care provider may feel your groin area and ask you to cough. How is this treated? Treatment for this condition varies depending on the size of your hernia and whether you have symptoms. If you do not have symptoms, your health care provider may have you watch your hernia carefully and come in for follow-up visits. If your hernia is larger or if you have symptoms, your treatment will include surgery. Follow these instructions at home: Lifestyle   Drink enough fluid to keep your urine clear or pale yellow.  Eat a diet that includes a lot of fiber. Eat plenty of fruits, vegetables, and whole grains. Talk with your health care provider if you have questions.  Avoid lifting heavy objects.  Avoid standing for long periods of time.  Do not use tobacco products, including cigarettes, chewing tobacco, or e-cigarettes. If you need help quitting, ask your health care provider.  Maintain a healthy weight. General instructions   Do not try to force the hernia back in.  Watch your hernia for any changes in color or size. Let your health care provider know if any changes occur.  Take over-the-counter and prescription medicines only as told by your health care provider.  Keep all follow-up visits as told by your health care provider. This is important. Contact a health care provider if:  You have a fever.  You have new symptoms.  Your   symptoms get worse. Get help right away if:  You have pain in the groin that suddenly gets worse.  A bulge in the groin gets bigger suddenly and does not go down.  You are a man and you have a sudden pain in the scrotum, or the size of your scrotum suddenly changes.  A bulge in the groin area becomes red or purple and is painful to the touch.  You have nausea or vomiting that does not go away.  You feel your heart beating a lot more quickly  than normal.  You cannot have a bowel movement or pass gas. This information is not intended to replace advice given to you by your health care provider. Make sure you discuss any questions you have with your health care provider. Document Released: 01/24/2009 Document Revised: 02/13/2016 Document Reviewed: 07/18/2014 Elsevier Interactive Patient Education  2017 [employer].   Patient to return as needed. The patient is aware to call back for any questions or concerns

## 2017-02-02 NOTE — Progress Notes (Signed)
Patient ID: Brett Crawford, male   DOB: Dec 13, 1950, 66 y.o.   MRN: [id number]  Chief Complaint  Patient presents with  . Follow-up    HPI Brett Crawford is a 66 y.o. male here today to discuss right inguinal hernia surgery. He states the area causes some discomfort while standing for extended periods of time such as when doing dishes. Otherwise asymptomatic during sporting events or strenuous activity.. Patient had a prostate biopsy done on  11/03/2016 The patient's daughter did get married last month as planned.    He's recently undergone treatment for prostate enlargement which required a catheter drainage for one week. No voiding difficulties at this time.  Marland KitchenHPI  Past Medical History:  Diagnosis Date  . Cancer [facility]) 2008   prostate  . Hyperlipidemia   . Hypertension   . Murmur    ASYMPTOMATIC PER PT    Past Surgical History:  Procedure Laterality Date  . COLONOSCOPY  2011 ?  Marland Kitchen HERNIA REPAIR  11/18/2011   ventral / Dr Bary Castilla  . high intesnty focused ultrasound ( HIFU)  2008  . PROSTATE BIOPSY N/A 11/03/2016   Procedure: PROSTATE BIOPSY;  Surgeon: Brett Cowper, MD;  Location: [facility] ORS;  Service: Urology;  Laterality: N/A;  . UMBILICAL HERNIA REPAIR  04/2011   Dr Bary Castilla    Family History  Problem Relation Age of Onset  . Stroke Father     Social History Social History  Substance Use Topics  . Smoking status: Never Smoker  . Smokeless tobacco: Never Used  . Alcohol use Yes     Comment: BEER OR WINE DAILY    No Known Allergies  Current Outpatient Prescriptions  Medication Sig Dispense Refill  . amLODipine (NORVASC) 10 MG tablet Take 10 mg by mouth every morning.     . finasteride (PROSCAR) 5 MG tablet Take 5 mg by mouth every morning.     Marland Kitchen levofloxacin (LEVAQUIN) 500 MG tablet Take 1 tablet (500 mg total) by mouth daily. 7 tablet 0  . naproxen sodium (ANAPROX) 220 MG tablet Take 440 mg by mouth daily as needed (pain).    . NUCYNTA 50 MG tablet Take 1 tablet (50  mg total) by mouth every 6 (six) hours as needed for moderate pain. 1 TO 2 TABS Q 6 HOURS PRN PAIN 30 tablet 0  . simvastatin (ZOCOR) 20 MG tablet Take 20 mg by mouth every morning.      No current facility-administered medications for this visit.     Review of Systems Review of Systems  Constitutional: Negative.   Respiratory: Negative.   Cardiovascular: Negative.     Blood pressure 130/80, pulse 74, resp. rate 14, height 5\' 7"  (1.702 m), weight 196 lb (88.9 kg).  Physical Exam Physical Exam  Constitutional: He is oriented to person, place, and time. He appears well-developed and well-nourished.  Eyes: Conjunctivae are normal. No scleral icterus.  Neck: Neck supple.  Cardiovascular: Normal rate, regular rhythm and normal heart sounds.   Pulmonary/Chest: Effort normal and breath sounds normal.  Abdominal: Soft. Normal appearance and bowel sounds are normal. There is no hepatomegaly. There is no tenderness. A hernia is present. Hernia confirmed positive in the right inguinal area (small right inguinal hernia).  Genitourinary:     Lymphadenopathy:    He has no cervical adenopathy.  Neurological: He is alert and oriented to person, place, and time.  Skin: Skin is warm and dry.    Data Reviewed MR of the adrenal completed thyroid 24,  2018 to follow-up, in a somewhat delayed fashion, the CT scan of the abdomen and pelvis dated 11/12/2011 showed no adrenal lesion. A small right renal cyst was identified.  Assessment    Small, minimally symptomatic right inguinal hernia.    Plan    Options for management reviewed: 1) sees early operative intervention versus 2) observation.  At this time the patient is going to see how he does and report if he becomes more symptomatic.    Patient to return as needed. The patient is aware to call back for any questions or concerns.   HPI, Physical Exam, Assessment and Plan have been scribed under the direction and in the presence of Hervey Ard, MD.  Gaspar Cola, CMA  I have completed the exam and reviewed the above documentation for accuracy and completeness.  I agree with the above.  Haematologist has been used and any errors in dictation or transcription are unintentional.  Hervey Ard, M.D., F.A.C.S.   Hernia precautions and incarceration were discussed with the patient. If they develop symptoms of an incarcerated hernia, they were encouraged to seek prompt medical attention.   Ankith Bellow 02/03/2017, 8:06 PM

## 2017-05-11 ENCOUNTER — Ambulatory Visit (INDEPENDENT_AMBULATORY_CARE_PROVIDER_SITE_OTHER): Payer: Medicare Other | Admitting: General Surgery

## 2017-05-11 VITALS — BP 132/74 | HR 74 | Resp 12 | Ht 67.0 in | Wt 190.0 lb

## 2017-05-11 DIAGNOSIS — K4090 Unilateral inguinal hernia, without obstruction or gangrene, not specified as recurrent: Secondary | ICD-10-CM | POA: Diagnosis not present

## 2017-05-11 NOTE — Progress Notes (Signed)
Patient ID: Brett Crawford, male   DOB: 1951/08/04, 66 y.o.   MRN: [id number]  Chief Complaint  Patient presents with  . Follow-up    HPI Brett Crawford is a 66 y.o. male here today re evaluation right inguinal hernia . Patient states he noticed some swelling about a month ago. Patient report being most symptomatic with long periods of standing.Marland Kitchen  HPI  Past Medical History:  Diagnosis Date  . Cancer [facility]) 2008   prostate  . Hyperlipidemia   . Hypertension   . Murmur    ASYMPTOMATIC PER PT    Past Surgical History:  Procedure Laterality Date  . COLONOSCOPY  2011 ?  Marland Kitchen HERNIA REPAIR  11/18/2011   ventral / Dr Bary Castilla  . high intesnty focused ultrasound ( HIFU)  2008  . PROSTATE BIOPSY N/A 11/03/2016   Procedure: PROSTATE BIOPSY;  Surgeon: Royston Cowper, MD;  Location: [facility] ORS;  Service: Urology;  Laterality: N/A;  . UMBILICAL HERNIA REPAIR  04/2011   Dr Bary Castilla    Family History  Problem Relation Age of Onset  . Stroke Father     Social History Social History  Substance Use Topics  . Smoking status: Never Smoker  . Smokeless tobacco: Never Used  . Alcohol use Yes     Comment: BEER OR WINE DAILY    No Known Allergies  Current Outpatient Prescriptions  Medication Sig Dispense Refill  . amLODipine (NORVASC) 10 MG tablet Take 10 mg by mouth every morning.     . finasteride (PROSCAR) 5 MG tablet Take 5 mg by mouth every morning.     Marland Kitchen levofloxacin (LEVAQUIN) 500 MG tablet Take 1 tablet (500 mg total) by mouth daily. 7 tablet 0  . naproxen sodium (ANAPROX) 220 MG tablet Take 440 mg by mouth daily as needed (pain).    . NUCYNTA 50 MG tablet Take 1 tablet (50 mg total) by mouth every 6 (six) hours as needed for moderate pain. 1 TO 2 TABS Q 6 HOURS PRN PAIN 30 tablet 0  . simvastatin (ZOCOR) 20 MG tablet Take 20 mg by mouth every morning.      No current facility-administered medications for this visit.     Review of Systems Review of Systems  Constitutional: Negative.    Respiratory: Negative.     Blood pressure 132/74, pulse 74, resp. rate 12, height 5\' 7"  (1.702 m), weight 190 lb (86.2 kg).  Physical Exam Physical Exam  Constitutional: He is oriented to person, place, and time. He appears well-developed and well-nourished.  Eyes: Conjunctivae are normal. No scleral icterus.  Neck: Neck supple.  Cardiovascular: Normal rate, regular rhythm and normal heart sounds.   Pulmonary/Chest: Effort normal and breath sounds normal.  Abdominal: Normal appearance. A hernia is present. Hernia confirmed positive in the right inguinal area.  Genitourinary:     Lymphadenopathy:    He has no cervical adenopathy.  Neurological: He is alert and oriented to person, place, and time.  Skin: Skin is warm and dry.    Data Reviewed No new data.  Assessment    Symptomatic right inguinal hernia.    Plan          Hernia precautions and incarceration were discussed with the patient. If they develop symptoms of an incarcerated hernia, they were encouraged to seek prompt medical attention.  I have recommended repair of the hernia using mesh on an outpatient basis in the near future. The risk of infection was reviewed. The role of prosthetic mesh  to minimize the risk of recurrence was reviewed.  HPI, Physical Exam, Assessment and Plan have been scribed under the direction and in the presence of Hervey Ard, MD.  Gaspar Cola, CMA  I have completed the exam and reviewed the above documentation for accuracy and completeness.  I agree with the above.  Haematologist has been used and any errors in dictation or transcription are unintentional.  Hervey Ard, M.D., F.A.C.S.  The patient is scheduled for surgery at [facility] on 05/25/17. He will pre admit by phone. The patient is aware of date and instructions.  Documented by Lesly Rubenstein LPN   Emeterio Bellow 05/11/2017, 9:29 PM

## 2017-05-11 NOTE — Patient Instructions (Addendum)

## 2017-05-14 ENCOUNTER — Encounter
Admission: RE | Admit: 2017-05-14 | Discharge: 2017-05-14 | Disposition: A | Discharge status: Home / Self Care | Payer: Medicare Other | Source: Ambulatory Visit | Attending: General Surgery | Admitting: General Surgery

## 2017-05-14 NOTE — Pre-Procedure Instructions (Signed)
Pt is out of time until Sunday Sept. 2, 2018.  Monday is a holiday, office will be closed.   Per pt request, called Dr, Ida Rogue office for copy of most recent labs.

## 2017-05-14 NOTE — Patient Instructions (Signed)
  Your procedure is scheduled on: Tuesday Sept. 4, 2018, 2018. Report to Same Day Surgery. To find out your arrival time please call [phone] between 1PM - 3PM on Friday May 21, 2017.  Remember: Instructions that are not followed completely may result in serious medical risk, up to and including death, or upon the discretion of your surgeon and anesthesiologist your surgery may need to be rescheduled.    _x___ 1. Do not eat food or drink liquids after midnight. No gum chewing or hard candies.     ____ 2. No Alcohol for 24 hours before or after surgery.   ____ 3. Bring all medications with you on the day of surgery if instructed.    __x__ 4. Notify your doctor if there is any change in your medical condition     (cold, fever, infections).    _____ 5. No smoking 24 hours prior to surgery.     Do not wear jewelry, make-up, hairpins, clips or nail polish.  Do not wear lotions, powders, or perfumes.   Do not shave 48 hours prior to surgery. Men may shave face and neck.  Do not bring valuables to the hospital.    [facility] is not responsible for any belongings or valuables.               Contacts, dentures or bridgework may not be worn into surgery.  Leave your suitcase in the car. After surgery it may be brought to your room.  For patients admitted to the hospital, discharge time is determined by your treatment team.   Patients discharged the day of surgery will not be allowed to drive home.    Please read over the following fact sheets that you were given:   [facility] Preparing for Surgery  __x__ Take these medicines the morning of surgery with A SIP OF WATER:    1. amLODipine (NORVASC)   2. simvastatin (ZOCOR)   ____ Fleet Enema (as directed)   ____ Use CHG Soap as directed on instruction sheet  ____ Use inhalers on the day of surgery and bring to hospital day of surgery  ____ Stop metformin 2 days prior to surgery    ____ Take 1/2 of usual insulin dose the  night before surgery and none on the morning of surgery.   ____ Stop Coumadin/Plavix/aspirin on does not apply.  _x___ Stop Anti-inflammatories such as Advil, Aleve, Ibuprofen, Motrin, Naproxen, Naprosyn, Goodies powders or aspirin  products. OK to take Tylenol.   ____ Stop supplements until after surgery.    ____ Bring C-Pap to the hospital.

## 2017-05-24 MED ORDER — CEFAZOLIN SODIUM-DEXTROSE 2-4 GM/100ML-% IV SOLN
2.0000 g | INTRAVENOUS | Status: AC
  Administered 2017-05-25: 2 g via INTRAVENOUS

## 2017-05-25 ENCOUNTER — Ambulatory Visit: Payer: Medicare Other | Admitting: Anesthesiology

## 2017-05-25 ENCOUNTER — Encounter
Admission: RE | Disposition: A | Discharge status: Home / Self Care | Payer: Self-pay | Source: Ambulatory Visit | Attending: General Surgery

## 2017-05-25 ENCOUNTER — Ambulatory Visit
Admission: RE | Admit: 2017-05-25 | Discharge: 2017-05-25 | Disposition: A | Discharge status: Home / Self Care | Payer: Medicare Other | Source: Ambulatory Visit | Attending: General Surgery | Admitting: General Surgery

## 2017-05-25 DIAGNOSIS — K4090 Unilateral inguinal hernia, without obstruction or gangrene, not specified as recurrent: Secondary | ICD-10-CM | POA: Insufficient documentation

## 2017-05-25 DIAGNOSIS — Z8546 Personal history of malignant neoplasm of prostate: Secondary | ICD-10-CM | POA: Diagnosis not present

## 2017-05-25 DIAGNOSIS — Z79899 Other long term (current) drug therapy: Secondary | ICD-10-CM | POA: Insufficient documentation

## 2017-05-25 DIAGNOSIS — I10 Essential (primary) hypertension: Secondary | ICD-10-CM | POA: Diagnosis not present

## 2017-05-25 HISTORY — PX: INGUINAL HERNIA REPAIR: SHX194

## 2017-05-25 SURGERY — REPAIR, HERNIA, INGUINAL, ADULT
Anesthesia: General | Laterality: Right | Wound class: Clean

## 2017-05-25 MED ORDER — DEXAMETHASONE SODIUM PHOSPHATE 10 MG/ML IJ SOLN
INTRAMUSCULAR | Status: AC
  Filled 2017-05-25: qty 1

## 2017-05-25 MED ORDER — DEXAMETHASONE SODIUM PHOSPHATE 10 MG/ML IJ SOLN
INTRAMUSCULAR | Status: DC | PRN
  Administered 2017-05-25: 4 mg via INTRAVENOUS

## 2017-05-25 MED ORDER — ACETAMINOPHEN 10 MG/ML IV SOLN
INTRAVENOUS | Status: AC
  Filled 2017-05-25: qty 100

## 2017-05-25 MED ORDER — HYDROCODONE-ACETAMINOPHEN 5-325 MG PO TABS: 1.0000 | ORAL_TABLET | ORAL | 0 refills | Status: DC | PRN

## 2017-05-25 MED ORDER — KETOROLAC TROMETHAMINE 30 MG/ML IJ SOLN
INTRAMUSCULAR | Status: AC
  Filled 2017-05-25: qty 1

## 2017-05-25 MED ORDER — HYDROCODONE-ACETAMINOPHEN 5-325 MG PO TABS
ORAL_TABLET | ORAL | Status: AC
  Filled 2017-05-25: qty 1

## 2017-05-25 MED ORDER — ONDANSETRON HCL 4 MG/2ML IJ SOLN: 4.0000 mg | Freq: Once | INTRAMUSCULAR | Status: DC | PRN

## 2017-05-25 MED ORDER — ONDANSETRON HCL 4 MG/2ML IJ SOLN
INTRAMUSCULAR | Status: DC | PRN
  Administered 2017-05-25: 4 mg via INTRAVENOUS

## 2017-05-25 MED ORDER — LIDOCAINE HCL (PF) 2 % IJ SOLN
INTRAMUSCULAR | Status: AC
  Filled 2017-05-25: qty 2

## 2017-05-25 MED ORDER — ONDANSETRON HCL 4 MG/2ML IJ SOLN
INTRAMUSCULAR | Status: AC
  Filled 2017-05-25: qty 2

## 2017-05-25 MED ORDER — GLYCOPYRROLATE 0.2 MG/ML IJ SOLN
INTRAMUSCULAR | Status: DC | PRN
  Administered 2017-05-25: 0.2 mg via INTRAVENOUS

## 2017-05-25 MED ORDER — BUPIVACAINE-EPINEPHRINE (PF) 0.5% -1:200000 IJ SOLN
INTRAMUSCULAR | Status: AC
  Filled 2017-05-25: qty 30

## 2017-05-25 MED ORDER — HYDROCODONE-ACETAMINOPHEN 5-325 MG PO TABS
1.0000 | ORAL_TABLET | ORAL | Status: DC | PRN
  Administered 2017-05-25: 1 via ORAL

## 2017-05-25 MED ORDER — BUPIVACAINE-EPINEPHRINE (PF) 0.5% -1:200000 IJ SOLN
INTRAMUSCULAR | Status: DC | PRN
  Administered 2017-05-25: 10 mL via PERINEURAL
  Administered 2017-05-25: 20 mL via PERINEURAL

## 2017-05-25 MED ORDER — FENTANYL CITRATE (PF) 100 MCG/2ML IJ SOLN: 25.0000 ug | INTRAMUSCULAR | Status: DC | PRN

## 2017-05-25 MED ORDER — FENTANYL CITRATE (PF) 100 MCG/2ML IJ SOLN
INTRAMUSCULAR | Status: DC | PRN
  Administered 2017-05-25 (×2): 25 ug via INTRAVENOUS
  Administered 2017-05-25: 50 ug via INTRAVENOUS

## 2017-05-25 MED ORDER — FENTANYL CITRATE (PF) 100 MCG/2ML IJ SOLN
INTRAMUSCULAR | Status: AC
  Filled 2017-05-25: qty 2

## 2017-05-25 MED ORDER — ACETAMINOPHEN 10 MG/ML IV SOLN
INTRAVENOUS | Status: DC | PRN
  Administered 2017-05-25: 1000 mg via INTRAVENOUS

## 2017-05-25 MED ORDER — PROPOFOL 10 MG/ML IV BOLUS
INTRAVENOUS | Status: AC
  Filled 2017-05-25: qty 20

## 2017-05-25 MED ORDER — LACTATED RINGERS IV SOLN
INTRAVENOUS | Status: DC
  Administered 2017-05-25: 07:00:00 via INTRAVENOUS

## 2017-05-25 MED ORDER — PROPOFOL 10 MG/ML IV BOLUS
INTRAVENOUS | Status: DC | PRN
  Administered 2017-05-25: 150 mg via INTRAVENOUS

## 2017-05-25 MED ORDER — FAMOTIDINE 20 MG PO TABS
ORAL_TABLET | ORAL | Status: AC
  Administered 2017-05-25: 20 mg via ORAL
  Filled 2017-05-25: qty 1

## 2017-05-25 MED ORDER — MIDAZOLAM HCL 2 MG/2ML IJ SOLN
INTRAMUSCULAR | Status: DC | PRN
  Administered 2017-05-25: 2 mg via INTRAVENOUS

## 2017-05-25 MED ORDER — FAMOTIDINE 20 MG PO TABS
20.0000 mg | ORAL_TABLET | Freq: Once | ORAL | Status: AC
  Administered 2017-05-25: 20 mg via ORAL

## 2017-05-25 MED ORDER — LIDOCAINE HCL (CARDIAC) 20 MG/ML IV SOLN
INTRAVENOUS | Status: DC | PRN
  Administered 2017-05-25: 50 mg via INTRAVENOUS

## 2017-05-25 MED ORDER — CEFAZOLIN SODIUM-DEXTROSE 2-4 GM/100ML-% IV SOLN
INTRAVENOUS | Status: AC
  Filled 2017-05-25: qty 100

## 2017-05-25 MED ORDER — PHENYLEPHRINE HCL 10 MG/ML IJ SOLN
INTRAMUSCULAR | Status: AC
  Filled 2017-05-25: qty 1

## 2017-05-25 MED ORDER — MIDAZOLAM HCL 2 MG/2ML IJ SOLN
INTRAMUSCULAR | Status: AC
  Filled 2017-05-25: qty 2

## 2017-05-25 MED ORDER — KETOROLAC TROMETHAMINE 30 MG/ML IJ SOLN
INTRAMUSCULAR | Status: DC | PRN
  Administered 2017-05-25: 30 mg

## 2017-05-25 SURGICAL SUPPLY — 34 items
BLADE SURG 15 STRL SS SAFETY (BLADE) ×6 IMPLANT
CANISTER SUCT 1200ML W/VALVE (MISCELLANEOUS) ×3 IMPLANT
CHLORAPREP W/TINT 26ML (MISCELLANEOUS) ×3 IMPLANT
CLOSURE WOUND 1/2 X4 (GAUZE/BANDAGES/DRESSINGS) ×1
DECANTER SPIKE VIAL GLASS SM (MISCELLANEOUS) IMPLANT
DRAIN PENROSE 1/4X12 LTX (DRAIN) ×3 IMPLANT
DRAPE LAPAROTOMY 100X77 ABD (DRAPES) ×3 IMPLANT
DRSG TEGADERM 4X4.75 (GAUZE/BANDAGES/DRESSINGS) ×3 IMPLANT
DRSG TELFA 4X3 1S NADH ST (GAUZE/BANDAGES/DRESSINGS) ×3 IMPLANT
ELECT REM PT RETURN 9FT ADLT (ELECTROSURGICAL) ×3
ELECTRODE REM PT RTRN 9FT ADLT (ELECTROSURGICAL) ×1 IMPLANT
GLOVE BIO SURGEON STRL SZ7.5 (GLOVE) ×6 IMPLANT
GLOVE INDICATOR 8.0 STRL GRN (GLOVE) ×6 IMPLANT
GOWN STRL REUS W/ TWL LRG LVL3 (GOWN DISPOSABLE) ×2 IMPLANT
GOWN STRL REUS W/TWL LRG LVL3 (GOWN DISPOSABLE) ×4
KIT RM TURNOVER STRD PROC AR (KITS) ×3 IMPLANT
LABEL OR SOLS (LABEL) ×3 IMPLANT
MESH HERNIA 6X12 ULTRAPRO MED (Mesh General) ×1 IMPLANT
MESH HERNIA ULTRAPRO MED (Mesh General) ×2 IMPLANT
NDL SAFETY 22GX1.5 (NEEDLE) ×6 IMPLANT
NEEDLE HYPO 25X1 1.5 SAFETY (NEEDLE) ×3 IMPLANT
PACK BASIN MINOR ARMC (MISCELLANEOUS) ×3 IMPLANT
STRIP CLOSURE SKIN 1/2X4 (GAUZE/BANDAGES/DRESSINGS) ×2 IMPLANT
SUT SURGILON 0 BLK (SUTURE) ×6 IMPLANT
SUT VIC AB 2-0 SH 27 (SUTURE) ×2
SUT VIC AB 2-0 SH 27XBRD (SUTURE) ×1 IMPLANT
SUT VIC AB 3-0 54X BRD REEL (SUTURE) ×1 IMPLANT
SUT VIC AB 3-0 BRD 54 (SUTURE) ×2
SUT VIC AB 3-0 SH 27 (SUTURE) ×2
SUT VIC AB 3-0 SH 27X BRD (SUTURE) ×1 IMPLANT
SUT VIC AB 4-0 FS2 27 (SUTURE) ×3 IMPLANT
SWABSTK COMLB BENZOIN TINCTURE (MISCELLANEOUS) ×3 IMPLANT
SYR 3ML LL SCALE MARK (SYRINGE) ×3 IMPLANT
SYR CONTROL 10ML (SYRINGE) ×6 IMPLANT

## 2017-05-25 NOTE — Anesthesia Postprocedure Evaluation (Signed)
Anesthesia Post Note  Patient: Brett Crawford  Procedure(s) Performed: Procedure(s) (LRB): HERNIA REPAIR INGUINAL ADULT (Right)  Patient location during evaluation: PACU Anesthesia Type: General Level of consciousness: awake and alert Pain management: pain level controlled Vital Signs Assessment: post-procedure vital signs reviewed and stable Respiratory status: spontaneous breathing, nonlabored ventilation, respiratory function stable and patient connected to nasal cannula oxygen Cardiovascular status: blood pressure returned to baseline and stable Postop Assessment: no signs of nausea or vomiting Anesthetic complications: no     Last Vitals:  Vitals:   05/25/17 0909 05/25/17 1006  BP: 131/83 124/71  Pulse: 68 72  Resp: 16 15  Temp: (!) 35.7 C   SpO2: 100% 99%    Last Pain:  Vitals:   05/25/17 1006  TempSrc:   PainSc: [place]

## 2017-05-25 NOTE — Op Note (Signed)
Preoperative diagnosis: Right inguinal hernia.  Postoperative diagnosis: Same.  Operative procedure: Repair of right indirect inguinal hernia with medium Ultra Pro mesh.  Operating surgeon: Ollen Bowl, M.D.  Anesthesia: Gen. By LMA, Marcaine 0.5% with 1-200,000 of epinephrine, 30 mL; Toradol 30 mg.  Estimated blood loss: Less than 5 mL.  Clinical note: This 66 year old male has developed a symptomatic right inguinal hernia and was ready for elective repair. Air was removed from the surgical site with clippers prior to presentation to the operating theater. The patient received 2 g prior to the procedure probiotic prophylaxis.  Operative note: With the patient under adequate general anesthesia the area was prepped with ChloraPrep and draped. Field block anesthesia was established with the above-mentioned local anesthesia. The skin was incised sharply and the remaining dissection completed with electrocautery. Hemostasis was with 3-0 Vicryl ties and cautery. The external blood per soap and the direction of its fibers. There is a moderate inflammatory response. The iliohypogastric nerve was identified and protected. The ilioinguinal nerve was not identified. The cord was mobilized and a sizable indirect sac was noted. This was freed circumferentially into the preperitoneal space. A medium Ultra Pro mesh was smoothed into the preperitoneal position and the external componentlaid along the floor the inguinal canal. The mesh was anchored to the pubic tubercle with 0 Surgilon suture. Interrupted 0 Surgilon sutures were used along the inguinal ligament. The medial and superior borders were anchored to the transverse abdominis aponeurosis. Toradol was placed in the wound. A lateral slit was made for cord passage. This was closed with interrupted sutures. The external Bleich was closed with a running 2-0 Vicryl suture. Scarpa's fascia was closed with a running 3-0 Vicryl suture. The skin closed with a  running 4-0 Vicryl subcuticular suture. Benzoin, Steri-Strips, Telfa and Tegaderm dressing applied.  The patient tolerated the procedure well and was taken to the recovery room in stable condition.

## 2017-05-25 NOTE — Transfer of Care (Signed)
Immediate Anesthesia Transfer of Care Note  Patient: Brett Crawford  Procedure(s) Performed: Procedure(s): HERNIA REPAIR INGUINAL ADULT (Right)  Patient Location: PACU  Anesthesia Type:General  Level of Consciousness: sedated  Airway & Oxygen Therapy: Patient Spontanous Breathing and Patient connected to face mask oxygen  Post-op Assessment: Report given to RN and Post -op Vital signs reviewed and stable  Post vital signs: Reviewed and stable  Last Vitals:  Vitals:   05/25/17 0613 05/25/17 0830  BP: 134/83 126/77  Pulse:  77  Resp: 17 13  Temp: 36.4 C 36.4 C  SpO2: 71% 85%    Complications: No apparent anesthesia complications

## 2017-05-25 NOTE — Anesthesia Post-op Follow-up Note (Signed)
Anesthesia QCDR form completed.        

## 2017-05-25 NOTE — Discharge Instructions (Signed)
AMBULATORY SURGERY  DISCHARGE INSTRUCTIONS   1) The drugs that you were given will stay in your system until tomorrow so for the next 24 hours you should not:  A) Drive an automobile B) Make any legal decisions C) Drink any alcoholic beverage   2) You may resume regular meals tomorrow.  Today it is better to start with liquids and gradually work up to solid foods.  You may eat anything you prefer, but it is better to start with liquids, then soup and crackers, and gradually work up to solid foods.   3) Please notify your doctor immediately if you have any unusual bleeding, trouble breathing, redness and pain at the surgery site, drainage, fever, or pain not relieved by medication.   4)  Additional Instructions: Drink plenty of liquids today.  Take the pain medication as needed.  Use stool softeners along with narcotic.  Take Tylenol and/or Ibuprofen in between doses to enhance the effects of the medicine.   Have something in your stomach prior to taking any meds.  Hold a pillow against your stomach/groin with moving, coughing or sitting/standing.     Please contact your physician with any problems or Same Day Surgery at [phone], Monday through Friday 6 am to 4 pm, or [place] at [facility] number at [phone].

## 2017-05-25 NOTE — OR Nursing (Signed)
Dr. Bary Castilla in to speak with patient.  Okayed for discharge.

## 2017-05-25 NOTE — Anesthesia Procedure Notes (Signed)
Procedure Name: LMA Insertion Performed by: Keamber Macfadden Pre-anesthesia Checklist: Patient identified, Patient being monitored, Timeout performed, Emergency Drugs available and Suction available Patient Re-evaluated:Patient Re-evaluated prior to induction Oxygen Delivery Method: Circle system utilized Preoxygenation: Pre-oxygenation with 100% oxygen Induction Type: IV induction LMA: LMA inserted LMA Size: 4.5 Tube type: Oral Number of attempts: 1 Placement Confirmation: positive ETCO2 and breath sounds checked- equal and bilateral Tube secured with: Tape Dental Injury: Teeth and Oropharynx as per pre-operative assessment        

## 2017-05-25 NOTE — H&P (Signed)
No change in clinical history or exam. For right inguinal hernia repair.  

## 2017-05-25 NOTE — Anesthesia Preprocedure Evaluation (Signed)
Anesthesia Evaluation  Patient identified by MRN, date of birth, ID band Patient awake    Reviewed: Allergy & Precautions, NPO status , Patient's Chart, lab work & pertinent test results, reviewed documented beta blocker date and time   Airway Mallampati: II  TM Distance: >3 FB     Dental  (+) Chipped   Pulmonary           Cardiovascular hypertension, Pt. on medications      Neuro/Psych    GI/Hepatic   Endo/Other    Renal/GU      Musculoskeletal   Abdominal   Peds  Hematology   Anesthesia Other Findings Prostate ca.  Reproductive/Obstetrics                             Anesthesia Physical Anesthesia Plan  ASA: III  Anesthesia Plan: General   Post-op Pain Management:    Induction: Intravenous  PONV Risk Score and Plan:   Airway Management Planned: LMA  Additional Equipment:   Intra-op Plan:   Post-operative Plan:   Informed Consent: I have reviewed the patients History and Physical, chart, labs and discussed the procedure including the risks, benefits and alternatives for the proposed anesthesia with the patient or authorized representative who has indicated his/her understanding and acceptance.     Plan Discussed with: CRNA  Anesthesia Plan Comments:         Anesthesia Quick Evaluation

## 2017-05-26 ENCOUNTER — Encounter: Payer: Self-pay | Admitting: General Surgery

## 2017-06-01 ENCOUNTER — Ambulatory Visit (INDEPENDENT_AMBULATORY_CARE_PROVIDER_SITE_OTHER): Payer: Medicare Other | Admitting: General Surgery

## 2017-06-01 ENCOUNTER — Encounter: Payer: Self-pay | Admitting: General Surgery

## 2017-06-01 VITALS — BP 120/82 | HR 72 | Resp 12 | Ht 67.0 in | Wt 194.0 lb

## 2017-06-01 DIAGNOSIS — K4090 Unilateral inguinal hernia, without obstruction or gangrene, not specified as recurrent: Secondary | ICD-10-CM

## 2017-06-01 NOTE — Patient Instructions (Addendum)
Patient to return as needed. The patient is aware to call back for any questions or concerns. Proper lifting techniques reviewed.  

## 2017-06-01 NOTE — Progress Notes (Signed)
Patient ID: Brett Crawford, male   DOB: [birth year], 66 y.o.   MRN: [id number]  Chief Complaint  Patient presents with  . Routine Post Op    HPI Brett Crawford is a 66 y.o. male here today for his post op right inguinal hernia repair done on 05/25/2017. Patient states he is doing well, but still a little sore. The patient required no narcotics. Rare use of Tylenol/Aleve.   lHPI  Past Medical History:  Diagnosis Date  . Cancer [facility]) 2008   prostate  . Hyperlipidemia   . Hypertension   . Murmur    ASYMPTOMATIC PER PT    Past Surgical History:  Procedure Laterality Date  . COLONOSCOPY  2011 ?  Marland Kitchen HERNIA REPAIR  11/18/2011   ventral / Dr Bary Castilla  . high intesnty focused ultrasound ( HIFU)  2008  . INGUINAL HERNIA REPAIR Right 05/25/2017   Indirect hernia repaired with medium Ultra Pro mesh  Surgeon: Eldin Bellow, MD;  Location: [facility] ORS;  Service: General;  Laterality: Right;  . PROSTATE BIOPSY N/A 11/03/2016   Procedure: PROSTATE BIOPSY;  Surgeon: Royston Cowper, MD;  Location: [facility] ORS;  Service: Urology;  Laterality: N/A;  . UMBILICAL HERNIA REPAIR  04/2011   Dr Bary Castilla    Family History  Problem Relation Age of Onset  . Stroke Father     Social History Social History  Substance Use Topics  . Smoking status: Never Smoker  . Smokeless tobacco: Never Used  . Alcohol use Yes     Comment: BEER OR WINE DAILY    No Known Allergies  Current Outpatient Prescriptions  Medication Sig Dispense Refill  . amLODipine (NORVASC) 10 MG tablet Take 10 mg by mouth every morning.     . finasteride (PROSCAR) 5 MG tablet Take 5 mg by mouth every morning.     . naproxen sodium (ANAPROX) 220 MG tablet Take 440 mg by mouth daily as needed (pain).    . simvastatin (ZOCOR) 20 MG tablet Take 20 mg by mouth every morning.      No current facility-administered medications for this visit.     Review of Systems Review of Systems  Constitutional: Negative.   Respiratory: Negative.    Cardiovascular: Negative.     Blood pressure 120/82, pulse 72, resp. rate 12, height 5\' 7"  (1.702 m), weight 194 lb (88 kg).  Physical Exam Physical Exam  Constitutional: He is oriented to person, place, and time. He appears well-developed and well-nourished.  Abdominal:  Right inguinal hernia repair intact and healing well.   Genitourinary:     Neurological: He is alert and oriented to person, place, and time.  Skin: Skin is warm and dry.        Assessment    Doing well status post repair of right indirect inguinal hernia.    Plan    Proper lifting technique reviewed. The patient may increase his activity as he is comfortable.    Patient to return as needed. The patient is aware to call back for any questions or concerns.   HPI, Physical Exam, Assessment and Plan have been scribed under the direction and in the presence of Hervey Ard, MD.  Gaspar Cola, CMA  Hernia precautions and incarceration were discussed with the patient. If they develop symptoms of an incarcerated hernia, they were encouraged to seek prompt medical attention.  I have recommended repair of the hernia using mesh on an outpatient basis in the near future. The risk of infection was reviewed.  The role of prosthetic mesh to minimize the risk of recurrence was reviewed.  Effie Bellow 06/01/2017, 5:48 PM

## 2018-10-04 ENCOUNTER — Other Ambulatory Visit: Payer: Self-pay

## 2018-10-04 ENCOUNTER — Emergency Department
Admission: EM | Admit: 2018-10-04 | Discharge: 2018-10-04 | Disposition: A | Discharge status: Home / Self Care | Payer: Medicare Other | Attending: Emergency Medicine | Admitting: Emergency Medicine

## 2018-10-04 ENCOUNTER — Emergency Department: Payer: Medicare Other

## 2018-10-04 DIAGNOSIS — I10 Essential (primary) hypertension: Secondary | ICD-10-CM | POA: Insufficient documentation

## 2018-10-04 DIAGNOSIS — Z8546 Personal history of malignant neoplasm of prostate: Secondary | ICD-10-CM | POA: Diagnosis not present

## 2018-10-04 DIAGNOSIS — R079 Chest pain, unspecified: Secondary | ICD-10-CM | POA: Insufficient documentation

## 2018-10-04 LAB — BASIC METABOLIC PANEL
ANION GAP: 7 (ref 5–15)
BUN: 16 mg/dL (ref 8–23)
CALCIUM: 8.8 mg/dL — AB (ref 8.9–10.3)
CO2: 21 mmol/L — ABNORMAL LOW (ref 22–32)
Chloride: 110 mmol/L (ref 98–111)
Creatinine, Ser: 1.05 mg/dL (ref 0.61–1.24)
Glucose, Bld: 95 mg/dL (ref 70–99)
POTASSIUM: 3.7 mmol/L (ref 3.5–5.1)
SODIUM: 138 mmol/L (ref 135–145)

## 2018-10-04 LAB — CBC
HCT: 43.8 % (ref 39.0–52.0)
HEMOGLOBIN: 14.3 g/dL (ref 13.0–17.0)
MCH: 28.4 pg (ref 26.0–34.0)
MCHC: 32.6 g/dL (ref 30.0–36.0)
MCV: 86.9 fL (ref 80.0–100.0)
PLATELETS: 236 10*3/uL (ref 150–400)
RBC: 5.04 MIL/uL (ref 4.22–5.81)
RDW: 14.6 % (ref 11.5–15.5)
WBC: 4.1 10*3/uL (ref 4.0–10.5)
nRBC: 0 % (ref 0.0–0.2)

## 2018-10-04 LAB — TROPONIN I: Troponin I: <0.03 ng/mL (ref <0.03)

## 2018-10-04 NOTE — ED Provider Notes (Signed)
[facility] Emergency Department Provider Note  ____________________________________________   First MD Initiated Contact with Patient 10/04/18 1816     (approximate)  I have reviewed the triage vital signs and the nursing notes.   HISTORY  Chief Complaint Chest Pain   HPI Brett Crawford is a 68 y.o. male with a history of hypertension as well as hyperlipidemia who was presented emergency department left-sided chest pain that is been ongoing over the past 8 to 9 days.  He says the pain is a "discomfort" which is approximately 2 out of 3 at this time.  He says that he also has the same discomfort in his left medial arm.  Says that the discomfort in both the chest and the arm are constant.  They are not worsened with movement or exertion.  He also notes a "twitch" in the muscle of his chest.  Denies any recent exertion or injury that would have caused injury to the chest.  Denies any shortness of breath, nausea or vomiting or diaphoresis.  Says that he called his primary care doctor today regarding the chest discomfort and he was told to come directly emergency department.  He first went to urgent care and then was sent to the emergency department.  Denies drug use.  Denies family history of cardiac disease.   Past Medical History:  Diagnosis Date  . Cancer [facility]) 2008   prostate  . Hyperlipidemia   . Hypertension   . Murmur    ASYMPTOMATIC PER PT    Patient Active Problem List   Diagnosis Date Noted  . Right inguinal hernia 10/29/2016    Past Surgical History:  Procedure Laterality Date  . COLONOSCOPY  2011 ?  Marland Kitchen HERNIA REPAIR  11/18/2011   ventral / Dr Bary Castilla  . high intesnty focused ultrasound ( HIFU)  2008  . INGUINAL HERNIA REPAIR Right 05/25/2017   Indirect hernia repaired with medium Ultra Pro mesh  Surgeon: Evangelos Bellow, MD;  Location: [facility] ORS;  Service: General;  Laterality: Right;  . PROSTATE BIOPSY N/A 11/03/2016   Procedure: PROSTATE  BIOPSY;  Surgeon: Royston Cowper, MD;  Location: [facility] ORS;  Service: Urology;  Laterality: N/A;  . UMBILICAL HERNIA REPAIR  04/2011   Dr Bary Castilla    Prior to Admission medications   Medication Sig Start Date End Date Taking? Authorizing Provider  amLODipine (NORVASC) 10 MG tablet Take 10 mg by mouth every morning.     [provider]  finasteride (PROSCAR) 5 MG tablet Take 5 mg by mouth every morning.     [provider]  naproxen sodium (ANAPROX) 220 MG tablet Take 440 mg by mouth daily as needed (pain).    [provider]  simvastatin (ZOCOR) 20 MG tablet Take 20 mg by mouth every morning.     [provider]    Allergies Patient has no known allergies.  Family History  Problem Relation Age of Onset  . Stroke Father     Social History Social History   Tobacco Use  . Smoking status: Never Smoker  . Smokeless tobacco: Never Used  Substance Use Topics  . Alcohol use: Yes    Comment: BEER OR WINE DAILY  . Drug use: No    Review of Systems  Constitutional: No fever/chills Eyes: No visual changes. ENT: No sore throat. Cardiovascular: As above Respiratory: Denies shortness of breath. Gastrointestinal: No abdominal pain.  No nausea, no vomiting.  No diarrhea.  No constipation. Genitourinary: Negative for  dysuria. Musculoskeletal: Negative for back pain. Skin: Negative for rash. Neurological: Negative for headaches, focal weakness or numbness.   ____________________________________________   PHYSICAL EXAM:  VITAL SIGNS: ED Triage Vitals  Enc Vitals Group     BP 10/04/18 1622 136/79     Pulse Rate 10/04/18 1622 67     Resp 10/04/18 1622 16     Temp 10/04/18 1622 98.4 F (36.9 C)     Temp Source 10/04/18 1622 Oral     SpO2 10/04/18 1622 100 %     Weight 10/04/18 1621 194 lb (88 kg)     Height 10/04/18 1621 5\' 7"  (1.702 m)     Head Circumference --      Peak Flow --      Pain Score 10/04/18 1621 4     Pain Loc --      Pain  Edu? --      Excl. in [place]? --     Constitutional: Alert and oriented. Well appearing and in no acute distress. Eyes: Conjunctivae are normal.  Head: Atraumatic. Nose: No congestion/rhinnorhea. Mouth/Throat: Mucous membranes are moist.  Neck: No stridor.   Cardiovascular: Normal rate, regular rhythm. Grossly normal heart sounds.  Good peripheral circulation with equal and bilateral radial pulses.  Patient without reproducible chest tenderness.  However, I do see the muscle fasciculation at the left sternal border which is intermittent. Respiratory: Normal respiratory effort.  No retractions. Lungs CTAB. Gastrointestinal: Soft and nontender. No distention.  Musculoskeletal: No lower extremity tenderness nor edema.  No joint effusions. Neurologic:  Normal speech and language. No gross focal neurologic deficits are appreciated. Skin:  Skin is warm, dry and intact. No rash noted. Psychiatric: Mood and affect are normal. Speech and behavior are normal.  ____________________________________________   LABS (all labs ordered are listed, but only abnormal results are displayed)  Labs Reviewed  BASIC METABOLIC PANEL - Abnormal; Notable for the following components:      Result Value   CO2 21 (*)    Calcium 8.8 (*)    All other components within normal limits  CBC  TROPONIN I   ____________________________________________  EKG  ED ECG REPORT I, Doran Stabler, the attending physician, personally viewed and interpreted this ECG.   Date: 10/04/2018  EKG Time: 1622  Rate: 63  Rhythm: normal sinus  Axis: normal  Intervals:none  ST&T Change: T wave inversions in 3 and aVF which were seen on previous EKGs.  ____________________________________________  RADIOLOGY  Chest x-ray without acute process. ____________________________________________   PROCEDURES  Procedure(s) performed:   Procedures  Critical Care performed:    ____________________________________________   INITIAL IMPRESSION / ASSESSMENT AND PLAN / ED COURSE  Pertinent labs & imaging results that were available during my care of the patient were reviewed by me and considered in my medical decision making (see chart for details).  Differential diagnosis includes, but is not limited to, ACS, aortic dissection, pulmonary embolism, cardiac tamponade, pneumothorax, pneumonia, pericarditis, myocarditis, GI-related causes including esophagitis/gastritis, and musculoskeletal chest wall pain.   As part of my medical decision making, I reviewed the following data within the electronic MEDICAL RECORD NUMBER Notes from prior ED visits  Patient with 8 to 9 days of symptoms with an unchanged EKG as well as a negative troponin.  Atypical story as well.  Patient feels well.  We discussed cardiology follow-up and he would prefer this.  Says that he does not intend is at the hospital.  I believe this is reasonable given the  story as well as the duration of the symptoms and the reassuring work-up that happened today.  Patient understands the diagnosis well treatment and willing to comply but will be discharged at this time.  Chest pain possibly related to muscle soreness as seen by the fasciculations.   ____________________________________________   FINAL CLINICAL IMPRESSION(S) / ED DIAGNOSES  Chest pain.  NEW MEDICATIONS STARTED DURING THIS VISIT:  New Prescriptions   No medications on file     Note:  This document was prepared using Dragon voice recognition software and may include unintentional dictation errors.     Orbie Pyo, MD 10/04/18 [place]

## 2018-10-04 NOTE — ED Notes (Signed)
Chest discomfort x1 week , left arm pain , blurred vision today

## 2018-10-04 NOTE — ED Notes (Signed)
Patient transported to X-ray 

## 2018-10-04 NOTE — ED Triage Notes (Signed)
States CP x week with L arm numbness. Moving L arm. Denies any other symptoms. No distress noted. A&O, in wheelchair. Speaking in complete sentences.

## 2019-07-25 ENCOUNTER — Other Ambulatory Visit: Payer: Self-pay | Admitting: Urology

## 2019-07-25 DIAGNOSIS — C61 Malignant neoplasm of prostate: Secondary | ICD-10-CM

## 2019-08-01 ENCOUNTER — Encounter
Admission: RE | Admit: 2019-08-01 | Discharge: 2019-08-01 | Disposition: A | Discharge status: Home / Self Care | Payer: Medicare Other | Source: Ambulatory Visit | Attending: Urology | Admitting: Urology

## 2019-08-01 ENCOUNTER — Other Ambulatory Visit: Payer: Medicare Other

## 2019-08-01 ENCOUNTER — Other Ambulatory Visit: Payer: Self-pay

## 2019-08-01 DIAGNOSIS — C61 Malignant neoplasm of prostate: Secondary | ICD-10-CM | POA: Diagnosis present

## 2019-08-01 MED ORDER — AXUMIN (FLUCICLOVINE F 18) INJECTION
10.0000 | Freq: Once | INTRAVENOUS | Status: AC | PRN
  Administered 2019-08-01: 13:00:00 10.67 via INTRAVENOUS

## 2019-10-30 ENCOUNTER — Other Ambulatory Visit: Payer: Self-pay | Admitting: Urology

## 2019-10-30 DIAGNOSIS — R972 Elevated prostate specific antigen [PSA]: Secondary | ICD-10-CM

## 2019-10-30 DIAGNOSIS — C61 Malignant neoplasm of prostate: Secondary | ICD-10-CM

## 2019-11-09 ENCOUNTER — Encounter
Admission: RE | Admit: 2019-11-09 | Discharge: 2019-11-09 | Disposition: A | Discharge status: Home / Self Care | Payer: Medicare PPO | Source: Ambulatory Visit | Attending: Urology | Admitting: Urology

## 2019-11-09 ENCOUNTER — Other Ambulatory Visit: Payer: Self-pay

## 2019-11-09 DIAGNOSIS — R972 Elevated prostate specific antigen [PSA]: Secondary | ICD-10-CM | POA: Diagnosis present

## 2019-11-09 DIAGNOSIS — C61 Malignant neoplasm of prostate: Secondary | ICD-10-CM

## 2019-11-09 MED ORDER — AXUMIN (FLUCICLOVINE F 18) INJECTION
10.0000 | Freq: Once | INTRAVENOUS | Status: AC | PRN
  Administered 2019-11-09: 13:00:00 10.24 via INTRAVENOUS

## 2019-12-26 IMAGING — CR DG CHEST 2V
1 series · 2 of 2 positions shown · non-contrast
Comparison: None.

CLINICAL DATA: Chest pain for 1 week

EXAM:
CHEST - 2 VIEW

[Series 1: dg chest 2 view · 0.14mm/px · 2 of 2 slices shown]
[im 1/2]
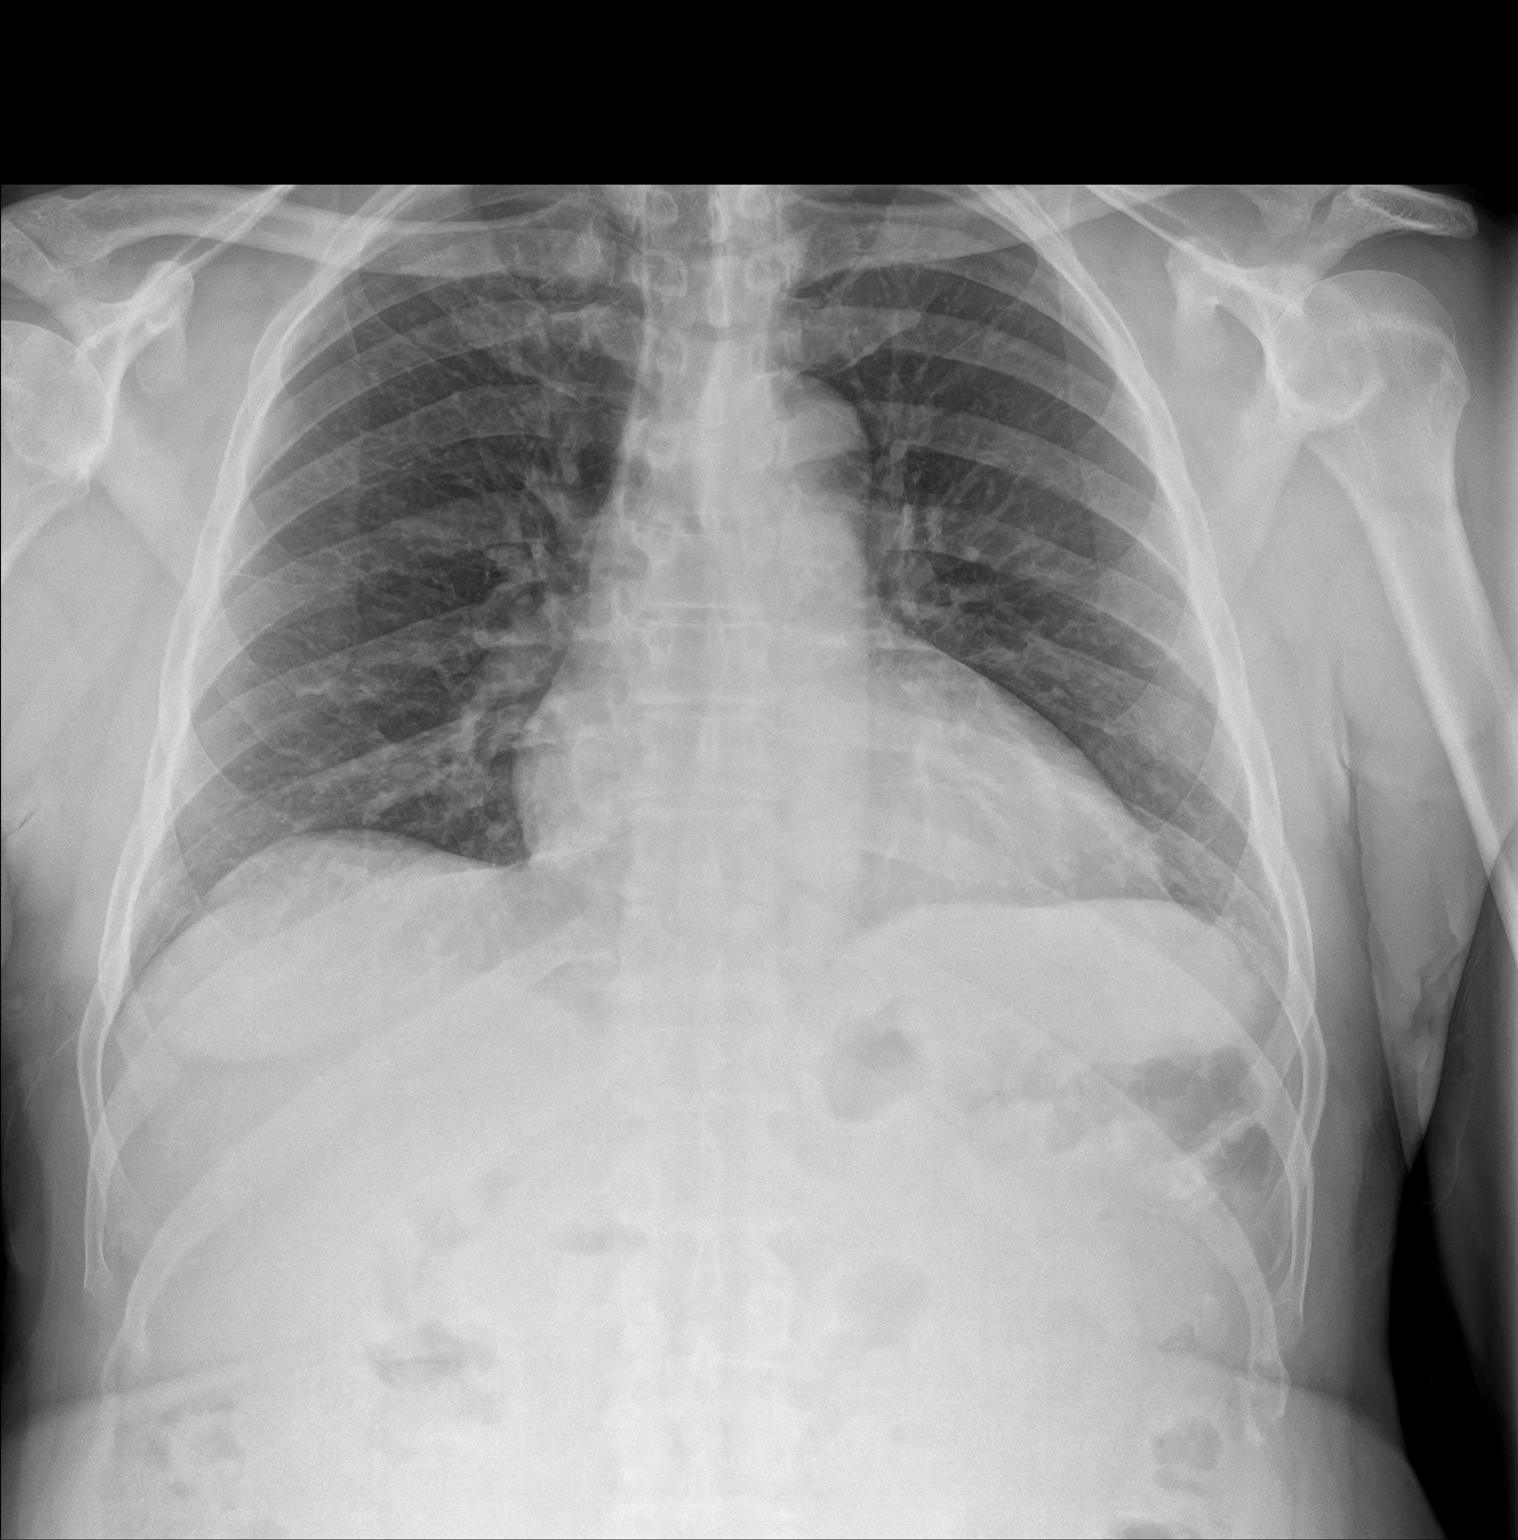
[im 2/2]
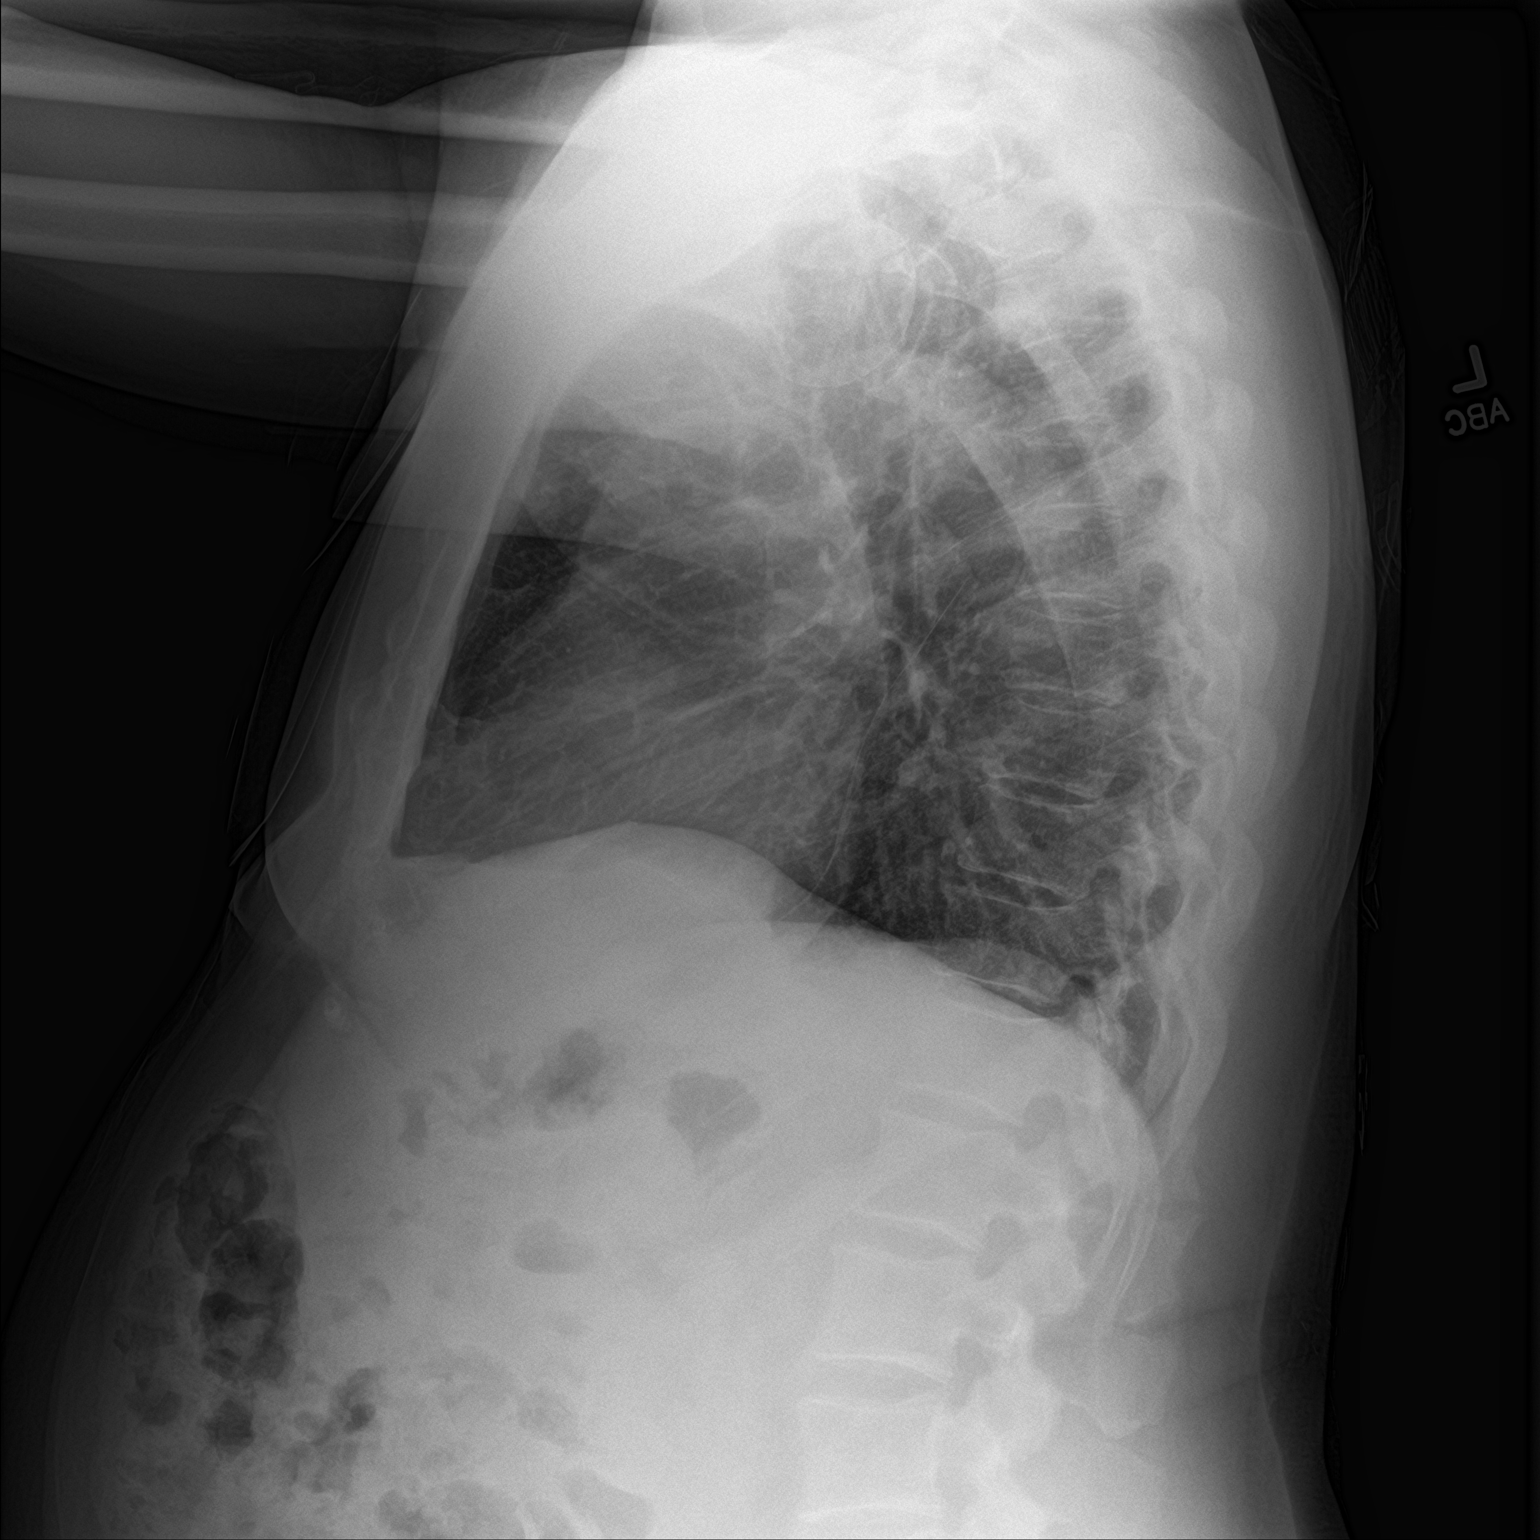

[2 of 2 positions shown; findings below may reference images not displayed]

FINDINGS: The heart size and mediastinal contours are within normal limits.
Both lungs are clear. The visualized skeletal structures are
unremarkable.
IMPRESSION: No active cardiopulmonary disease.

## 2020-05-20 ENCOUNTER — Other Ambulatory Visit: Payer: Self-pay | Admitting: Urology

## 2020-05-20 DIAGNOSIS — R972 Elevated prostate specific antigen [PSA]: Secondary | ICD-10-CM

## 2020-06-07 ENCOUNTER — Ambulatory Visit
Admission: RE | Admit: 2020-06-07 | Discharge: 2020-06-07 | Disposition: A | Discharge status: Home / Self Care | Payer: Medicare PPO | Source: Ambulatory Visit | Attending: Urology | Admitting: Urology

## 2020-06-07 ENCOUNTER — Other Ambulatory Visit: Payer: Self-pay

## 2020-06-07 DIAGNOSIS — R972 Elevated prostate specific antigen [PSA]: Secondary | ICD-10-CM | POA: Insufficient documentation

## 2020-06-07 MED ORDER — GADOBUTROL 1 MMOL/ML IV SOLN
9.0000 mL | Freq: Once | INTRAVENOUS | Status: AC | PRN
  Administered 2020-06-07: 9 mL via INTRAVENOUS

## 2020-06-27 ENCOUNTER — Encounter
Admission: RE | Admit: 2020-06-27 | Discharge: 2020-06-27 | Disposition: A | Discharge status: Home / Self Care | Payer: Medicare PPO | Source: Ambulatory Visit | Attending: Urology | Admitting: Urology

## 2020-06-27 ENCOUNTER — Other Ambulatory Visit: Payer: Self-pay

## 2020-06-27 NOTE — Patient Instructions (Signed)
Your procedure is scheduled on: Thurs 10/14 Report to Day Surgery. To find out your arrival time please call [phone] between 1PM - 3PM on Wed. 10/13  Remember: Instructions that are not followed completely may result in serious medical risk,  up to and including death, or upon the discretion of your surgeon and anesthesiologist your  surgery may need to be rescheduled.     _X__ 1. Do not eat food after midnight the night before your procedure.                 No chewing gum or hard candies. You may drink clear liquids up to 2 hours                 before you are scheduled to arrive for your surgery- DO not drink clear                 liquids within 2 hours of the start of your surgery.                 Clear Liquids include:  water, apple juice without pulp, clear Gatorade, G2 or                  Gatorade Zero (avoid Red/Purple/Blue), Black Coffee or Tea (Do not add                 anything to coffee or tea). _____2.   Complete the "Ensure Clear Pre-surgery Clear Carbohydrate Drink" provided to you, 2 hours before arrival. **If you       are diabetic you will be provided with an alternative drink, Gatorade Zero or G2.  __X__2.  On the morning of surgery brush your teeth with toothpaste and water, you                may rinse your mouth with mouthwash if you wish.  Do not swallow any toothpaste of mouthwash.     _X__ 3.  No Alcohol for 24 hours before or after surgery.   ___ 4.  Do Not Smoke or use e-cigarettes For 24 Hours Prior to Your Surgery.                 Do not use any chewable tobacco products for at least 6 hours prior to                 Surgery.  ___  5.  Do not use any recreational drugs (marijuana, cocaine, heroin, ecstasy, MDMA or other)                For at least one week prior to your surgery.  Combination of these drugs with anesthesia                May have life threatening results.  ____  6.  Bring all medications with you on the day of  surgery if instructed.   _x___  7.  Notify your doctor if there is any change in your medical condition      (cold, fever, infections).     Do not wear jewelry,  Do not wear lotions, powders, or perfumes. You may wear deodorant. Do not shave 48 hours prior to surgery. Men may shave face and neck. Do not bring valuables to the hospital.    [facility] is not responsible for any belongings or valuables.  Contacts, dentures or bridgework may not be worn into surgery. Leave your suitcase in the car. After  surgery it may be brought to your room. For patients admitted to the hospital, discharge time is determined by your treatment team.   Patients discharged the day of surgery will not be allowed to drive home.   Make arrangements for someone to be with you for the first 24 hours of your Same Day Discharge.    Please read over the following fact sheets that you were given:       __x__ Take these medicines the morning of surgery with A SIP OF WATER:    1. amLODipine (NORVASC) 10 MG tablet  2. finasteride (PROSCAR) 5 MG tablet  3. simvastatin (ZOCOR) 20 MG tablet  4.  5.  6.  __x__ Fleet Enema (as directed)   __x__  Shower night before and morning of surgery after fleets enema   ____ Use Benzoyl Peroxide Gel as instructed  ____ Use inhalers on the day of surgery  ____ Stop metformin 2 days prior to surgery    ____ Take 1/2 of usual insulin dose the night before surgery. No insulin the morning          of surgery.   ____ Stop Coumadin/Plavix/aspirin on   __x__ Stop Anti-inflammatories ibuprofen aleve or aspirin products today   ____ Stop supplements until after surgery.    ____ Bring C-Pap to the hospital.    If you have any questions regarding your pre-procedure instructions,  Please call Pre-admit Testing at [place]

## 2020-06-28 NOTE — H&P (Signed)
NAMESHERYL, TOWELL MEDICAL RECORD AV:[id number] ACCOUNT [id number] DATE OF BIRTH:[birth year] FACILITY: [facility] LOCATION: [facility] PHYSICIAN:Brayon Bielefeld Farrel Conners, MD  HISTORY AND PHYSICAL  DATE OF ADMISSION:  07/04/2020  CHIEF COMPLAINT:  Prostate cancer with possible recurrence.  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   The PSA nadir was 0.8 nanograms from July 2018.  More recently, the PSA has increased up to 5.5 08/25.  He did have an Axumin scan performed in February of this year, which did not reveal any evidence of disease.  He had a prostate MRI scan performed  09/17 which revealed a small area of hyperenhancement of the left prostatic apex suspicious for recurrence.  He comes in now for UroNav fusion biopsy of the prostate.  PAST MEDICAL HISTORY:    ALLERGIES:  No drug allergies.  CURRENT MEDICATIONS:  Amlodipine and simvastatin.  PAST SURGICAL HISTORY: 1.  Colonoscopy 2003. 2.  HIFU 2008 and 2018.  SOCIAL HISTORY:  The patient denied tobacco use.  He consumes alcohol moderately.  FAMILY HISTORY:  The patient has a brother living with lymphoma.  Mother died of stomach cancer.  There is no family history of prostate cancer.  PAST AND CURRENT MEDICAL CONDITIONS:    1.  Hypertension. 2.  Hypercholesterolemia.  REVIEW OF SYSTEMS:  The patient denies chest pain, shortness of breath, diabetes, stroke or heart disease.  PHYSICAL EXAMINATION: GENERAL:  Well-nourished African-American male in no acute distress. VITAL SIGNS:  Weight was 193 pounds. HEENT:  Sclerae were clear.  Pupils are equally round, reactive to light and accommodation.  Extraocular movements intact. NECK:  No palpable cervical adenopathy. PULMONARY:  Lungs clear to auscultation. CARDIOVASCULAR:   Regular rhythm and rate. ABDOMEN:  Soft, nontender abdomen. GENITOURINARY:  Uncircumcised.  Testes were smooth, nontender, approximately 18 mL in size. RECTAL:  Internal hemorrhoids.  No palpable rectal masses. NEUROMUSCULAR:  Alert and oriented x3.  IMPRESSION:  Probable prostate cancer recurrence.  PLAN:  UroNav fusion biopsy of the prostate.  CN/NUANCE  D:06/27/2020 T:06/27/2020 JOB:[id number]

## 2020-07-02 ENCOUNTER — Encounter
Admission: RE | Admit: 2020-07-02 | Discharge: 2020-07-02 | Disposition: A | Discharge status: Home / Self Care | Payer: Medicare PPO | Source: Ambulatory Visit | Attending: Urology | Admitting: Urology

## 2020-07-02 ENCOUNTER — Other Ambulatory Visit: Payer: Self-pay

## 2020-07-02 ENCOUNTER — Other Ambulatory Visit: Payer: Medicare PPO

## 2020-07-02 ENCOUNTER — Encounter: Payer: Self-pay | Admitting: Urology

## 2020-07-02 DIAGNOSIS — I10 Essential (primary) hypertension: Secondary | ICD-10-CM | POA: Diagnosis not present

## 2020-07-02 DIAGNOSIS — Z01818 Encounter for other preprocedural examination: Secondary | ICD-10-CM | POA: Diagnosis not present

## 2020-07-02 DIAGNOSIS — Z20822 Contact with and (suspected) exposure to covid-19: Secondary | ICD-10-CM | POA: Diagnosis not present

## 2020-07-02 NOTE — Progress Notes (Signed)
[facility] Perioperative Services  Pre-Admission/Anesthesia Testing Clinical Review  Date: 07/02/20  Patient Demographics:  Name: Brett Crawford DOB:   September 30, 1950 MRN:   [id number]  Planned Surgical Procedure(s):    Case: [id number] Date/Time: 07/04/20 1050   Procedure: PROSTATE BIOPSY (N/A )   Anesthesia type: Choice   Pre-op diagnosis: PROSTATE CANCER   Location: [place] OR ROOM 10 / [place] [facility]S FOR ANESTHESIA GROUP   Surgeons: Royston Cowper, MD     NOTE: Available PAT nursing documentation and vital signs have been reviewed. Clinical nursing staff has updated patient's PMH/PSHx, current medication list, and drug allergies/intolerances to ensure comprehensive history available to assist in medical decision making as it pertains to the aforementioned surgical procedure and anticipated anesthetic course.   Clinical Discussion:  Brett Crawford is a 69 y.o. male who is submitted for pre-surgical anesthesia review and clearance prior to him undergoing the above procedure. Patient has never been a smoker. Pertinent PMH includes: atypical angina, abnormal EKG, cardiac murmur, HTN, HLD, prostate cancer (s/p HIFU 2008 in 2018).  Patient is followed by cardiology Regina Eck, MD). He was last seen in the cardiology clinic on 05/24/2020; notes reviewed.  At the time of his clinic visit, patient reporting that he was doing well overall.  Patient had ongoing exertional chest pain associated with lifting only.  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  Patient denies any significant angina, shortness of breath, palpitations, PND, orthopnea, peripheral edema, vertiginous symptoms, and presyncope/syncope.  Patient does not monitor blood pressure at home; 135/85 in the office on CCB monotherapy (amlodipine).  Patient uses daily ETOH, however does not smoke.  EKG done on 10/28/2018 revealed NSR with a first-degree AV block.  There were new lateral TWI's noted  and leads V5 and V6.  Echo stress test at that time was interpreted as normal by cardiology; LVEF 55% (see full interpretation of cardiovascular testing below).  Patient scheduled to follow-up with outpatient cardiology in 1 year, or sooner for any acute needs.  Patient scheduled to undergo prostate biopsy on 07/04/2020 with Dr. Nash Mantis.  Patient presented to PAT today where he had an EKG that revealed anterolateral T wave inversions in leads V4 through V6.  Given patient's recent episodes of chest pain and visit with cardiology back in February of this year, coupled with the findings of his EKG today, presurgical cardiac clearance was sought by PAT team.  Today's EKG reviewed by primary cardiologist with notation "similar to 10/2018 EKG at time of negative stress echo". Per cardiology, "patient is optimized for sure and may proceed with planned surgical procedure with a LOW risk stratification". This patient is not on daily anticoagulation therapy.   He denies previous perioperative complications with anesthesia. He underwent a MAC anesthetic course at [facility] (ASA II) in 03/2020 with no documented complications.   Vitals with BMI 06/27/2020 10/04/2018 10/04/2018  Height 5' 7.5" - -  Weight 193 lbs - -  BMI 15.40 - -  Systolic - 086 761  Diastolic - 80 91  Pulse - 57 54    Providers/Specialists:   NOTE: Primary physician provider listed below. Patient may have been seen by APP or partner within same practice.   PROVIDER ROLE LAST Towana Badger, MD Urology (Surgeon) 06/27/2020  Renee Rival, NP Primary Care Provider ?  Seleta Rhymes, MD Cardiology 05/24/2020   Allergies:  Patient has no known allergies.  Current Home Medications:  No current facility-administered medications for this encounter.   Marland Kitchen amLODipine (NORVASC) 10 MG tablet  . finasteride (PROSCAR) 5 MG tablet  . hydroxypropyl methylcellulose / hypromellose (ISOPTO TEARS / GONIOVISC) 2.5 % ophthalmic solution  .  naproxen sodium (ANAPROX) 220 MG tablet  . simvastatin (ZOCOR) 20 MG tablet   History:   Past Medical History:  Diagnosis Date  . Atypical chest pain   . Cancer [facility]) 2008   prostate  . Hyperlipidemia   . Hypertension   . Murmur    ASYMPTOMATIC PER PT  . T wave inversion on electrocardiogram    anterolateral   Past Surgical History:  Procedure Laterality Date  . COLONOSCOPY  2011 ?  Marland Kitchen EYE SURGERY Right   . HERNIA REPAIR  11/18/2011   ventral / Dr Bary Castilla  . high intesnty focused ultrasound ( HIFU)  2008 and 2018  . INGUINAL HERNIA REPAIR Right 05/25/2017   Indirect hernia repaired with medium Ultra Pro mesh  Surgeon: Mancel Bellow, MD;  Location: [facility] ORS;  Service: General;  Laterality: Right;  . PROSTATE BIOPSY N/A 11/03/2016   Procedure: PROSTATE BIOPSY;  Surgeon: Royston Cowper, MD;  Location: [facility] ORS;  Service: Urology;  Laterality: N/A;  . UMBILICAL HERNIA REPAIR  04/2011   Dr Bary Castilla   Family History  Problem Relation Age of Onset  . Stroke Father    Social History   Tobacco Use  . Smoking status: Never Smoker  . Smokeless tobacco: Never Used  Vaping Use  . Vaping Use: Never used  Substance Use Topics  . Alcohol use: Yes    Comment: BEER OR WINE DAILY  . Drug use: No    Pertinent Clinical Results:   ECG: Date: 07/02/2020 Time ECG obtained: 1014 AM Rate: 59 bpm Rhythm: Sinus bradycardia with first-degree AV block Axis (leads I and aVF): Normal Intervals: PR 212 ms. QRS 94 ms. QTc 392 ms. ST segment and T wave changes: Anterolateral T wave inversion in leads V3-V6  Comparison: Similar to previous tracing obtained on 10/28/2018; TWI evident in anterior leads at this point.   IMAGING / PROCEDURES: ECHO STRESS TEST done on 10/28/2018 1. LVEF 55% 2. Normal stress test 3. Normal LA pressures with normal diastolic function 4. Trivial MR, PR, TR; no AR 5. No valvular stenosis 6. Hypertensive response to exercise 7. Maximum workload 13.4 METS was  achieved in excess  Impression and Plan:  Brett Crawford has been referred for pre-anesthesia review and clearance prior to him undergoing the planned anesthetic and procedural courses. Available labs, pertinent testing, and imaging results were personally reviewed by me.  ECG PAT clinic noted to have anterolateral T wave inversions present.  When compared to previous ECG from 10/2018, T wave inversions are now evident in the anterior leads.  Today's ECG reviewed with primary cardiologist (see HPI); no significant change.  This patient has been appropriately cleared by cardiology.  Based on clinical review performed today (07/02/20), barring any significant acute changes in the patient's overall condition, it is anticipated that he will be able to proceed with the planned surgical intervention. Any acute changes in clinical condition may necessitate his procedure being postponed and/or cancelled. Pre-surgical instructions were reviewed with the patient during his PAT appointment and questions were fielded by PAT clinical staff.  Honor Loh, MSN, APRN, FNP-C, CEN [facility]  Peri-operative Services Nurse Practitioner Phone: [phone] 07/02/20 2:19 PM  NOTE: This note has been prepared using Dragon dictation software. Despite my  best ability to proofread, there is always the potential that unintentional transcriptional errors may still occur from this process.

## 2020-07-03 LAB — SARS CORONAVIRUS 2 (TAT 6-24 HRS): SARS Coronavirus 2: NEGATIVE

## 2020-07-03 MED ORDER — GENTAMICIN IN SALINE 0.8-0.9 MG/ML-% IV SOLN
80.0000 mg | INTRAVENOUS | Status: AC
  Administered 2020-07-04: 80 mg via INTRAVENOUS
  Filled 2020-07-03: qty 100

## 2020-07-04 ENCOUNTER — Encounter
Admission: RE | Disposition: A | Discharge status: Home / Self Care | Payer: Self-pay | Source: Home / Self Care | Attending: Urology

## 2020-07-04 ENCOUNTER — Encounter: Payer: Self-pay | Admitting: Urology

## 2020-07-04 ENCOUNTER — Ambulatory Visit
Admission: RE | Admit: 2020-07-04 | Discharge: 2020-07-04 | Disposition: A | Discharge status: Home / Self Care | Payer: Medicare PPO | Attending: Urology | Admitting: Urology

## 2020-07-04 ENCOUNTER — Ambulatory Visit: Payer: Medicare PPO | Admitting: Urgent Care

## 2020-07-04 ENCOUNTER — Other Ambulatory Visit: Payer: Self-pay

## 2020-07-04 DIAGNOSIS — Z79899 Other long term (current) drug therapy: Secondary | ICD-10-CM | POA: Diagnosis not present

## 2020-07-04 DIAGNOSIS — C61 Malignant neoplasm of prostate: Secondary | ICD-10-CM | POA: Insufficient documentation

## 2020-07-04 DIAGNOSIS — Z8349 Family history of other endocrine, nutritional and metabolic diseases: Secondary | ICD-10-CM | POA: Diagnosis not present

## 2020-07-04 DIAGNOSIS — I10 Essential (primary) hypertension: Secondary | ICD-10-CM | POA: Insufficient documentation

## 2020-07-04 HISTORY — DX: Other chest pain: R07.89

## 2020-07-04 HISTORY — DX: Abnormal electrocardiogram (ECG) (EKG): R94.31

## 2020-07-04 HISTORY — PX: PROSTATE BIOPSY: SHX241

## 2020-07-04 SURGERY — BIOPSY, PROSTATE
Anesthesia: General

## 2020-07-04 MED ORDER — CHLORHEXIDINE GLUCONATE 0.12 % MT SOLN: 15.0000 mL | Freq: Once | OROMUCOSAL | Status: AC

## 2020-07-04 MED ORDER — CHLORHEXIDINE GLUCONATE 0.12 % MT SOLN
OROMUCOSAL | Status: AC
  Administered 2020-07-04: 15 mL via OROMUCOSAL
  Filled 2020-07-04: qty 15

## 2020-07-04 MED ORDER — GLYCOPYRROLATE 0.2 MG/ML IJ SOLN
INTRAMUSCULAR | Status: DC | PRN
  Administered 2020-07-04: .2 mg via INTRAVENOUS

## 2020-07-04 MED ORDER — LIDOCAINE HCL (CARDIAC) PF 100 MG/5ML IV SOSY
PREFILLED_SYRINGE | INTRAVENOUS | Status: DC | PRN
  Administered 2020-07-04: 50 mg via INTRAVENOUS

## 2020-07-04 MED ORDER — DEXAMETHASONE SODIUM PHOSPHATE 10 MG/ML IJ SOLN
INTRAMUSCULAR | Status: DC | PRN
  Administered 2020-07-04: 5 mg via INTRAVENOUS

## 2020-07-04 MED ORDER — CEFAZOLIN SODIUM-DEXTROSE 1-4 GM/50ML-% IV SOLN
1.0000 g | Freq: Once | INTRAVENOUS | Status: AC
  Administered 2020-07-04: 1 g via INTRAVENOUS

## 2020-07-04 MED ORDER — LEVOFLOXACIN 500 MG PO TABS: 500.0000 mg | ORAL_TABLET | Freq: Every day | ORAL | 0 refills | Status: DC

## 2020-07-04 MED ORDER — ONDANSETRON HCL 4 MG/2ML IJ SOLN: 4.0000 mg | Freq: Once | INTRAMUSCULAR | Status: DC | PRN

## 2020-07-04 MED ORDER — ORAL CARE MOUTH RINSE: 15.0000 mL | Freq: Once | OROMUCOSAL | Status: AC

## 2020-07-04 MED ORDER — ONDANSETRON HCL 4 MG/2ML IJ SOLN
INTRAMUSCULAR | Status: DC | PRN
  Administered 2020-07-04: 4 mg via INTRAVENOUS

## 2020-07-04 MED ORDER — FENTANYL CITRATE (PF) 100 MCG/2ML IJ SOLN
INTRAMUSCULAR | Status: AC
  Filled 2020-07-04: qty 2

## 2020-07-04 MED ORDER — MIDAZOLAM HCL 2 MG/2ML IJ SOLN
INTRAMUSCULAR | Status: DC | PRN
  Administered 2020-07-04: 2 mg via INTRAVENOUS

## 2020-07-04 MED ORDER — CEFAZOLIN SODIUM-DEXTROSE 1-4 GM/50ML-% IV SOLN
INTRAVENOUS | Status: AC
  Filled 2020-07-04: qty 50

## 2020-07-04 MED ORDER — LIDOCAINE HCL (PF) 2 % IJ SOLN
INTRAMUSCULAR | Status: AC
  Filled 2020-07-04: qty 5

## 2020-07-04 MED ORDER — ONDANSETRON HCL 4 MG/2ML IJ SOLN
INTRAMUSCULAR | Status: AC
  Filled 2020-07-04: qty 2

## 2020-07-04 MED ORDER — EPHEDRINE 5 MG/ML INJ
INTRAVENOUS | Status: AC
  Filled 2020-07-04: qty 10

## 2020-07-04 MED ORDER — GLYCOPYRROLATE 0.2 MG/ML IJ SOLN
INTRAMUSCULAR | Status: AC
  Filled 2020-07-04: qty 1

## 2020-07-04 MED ORDER — MIDAZOLAM HCL 2 MG/2ML IJ SOLN
INTRAMUSCULAR | Status: AC
  Filled 2020-07-04: qty 2

## 2020-07-04 MED ORDER — LACTATED RINGERS IV SOLN: INTRAVENOUS | Status: DC

## 2020-07-04 MED ORDER — PROPOFOL 10 MG/ML IV BOLUS
INTRAVENOUS | Status: DC | PRN
  Administered 2020-07-04: 130 mg via INTRAVENOUS

## 2020-07-04 MED ORDER — DEXAMETHASONE SODIUM PHOSPHATE 10 MG/ML IJ SOLN
INTRAMUSCULAR | Status: AC
  Filled 2020-07-04: qty 1

## 2020-07-04 MED ORDER — FLEET ENEMA 7-19 GM/118ML RE ENEM: 1.0000 | ENEMA | Freq: Once | RECTAL | Status: DC

## 2020-07-04 MED ORDER — FENTANYL CITRATE (PF) 100 MCG/2ML IJ SOLN: 25.0000 ug | INTRAMUSCULAR | Status: DC | PRN

## 2020-07-04 MED ORDER — FENTANYL CITRATE (PF) 100 MCG/2ML IJ SOLN
INTRAMUSCULAR | Status: DC | PRN
Start: 2020-07-04 — End: 2020-07-04
  Administered 2020-07-04 (×4): 25 ug via INTRAVENOUS

## 2020-07-04 SURGICAL SUPPLY — 18 items
COVER MAYO STAND REUSABLE (DRAPES) ×3 IMPLANT
COVER TRANSDUCER ULTRASOUND (MISCELLANEOUS) ×2 IMPLANT
GLOVE BIO SURGEON STRL SZ7 (GLOVE) ×6 IMPLANT
GUIDE NDL ENDOCAV 16-18 CVR (NEEDLE) IMPLANT
GUIDE NDL URONAV ULTRASND S (MISCELLANEOUS) IMPLANT
GUIDE NEEDLE ENDOCAV 16-18 CVR (NEEDLE) IMPLANT
GUIDE NEEDLE URONAV ULTRASND S (MISCELLANEOUS) ×1 IMPLANT
INST BIOPSY MAXCORE 18GX25 (NEEDLE) ×3 IMPLANT
NDL GUIDE BIOPSY 644068 (NEEDLE) IMPLANT
NEEDLE GUIDE BIOPSY 644068 (NEEDLE) IMPLANT
PROBE URONAV BK 8808E 8818 HLD (MISCELLANEOUS) IMPLANT
STRAP SAFETY BODY (MISCELLANEOUS) ×3 IMPLANT
SURGILUBE 2OZ TUBE FLIPTOP (MISCELLANEOUS) ×3 IMPLANT
TOWEL OR 17X26 4PK STRL BLUE (TOWEL DISPOSABLE) ×3 IMPLANT
URONAV BK 8808E 8818 PROBE HLD (MISCELLANEOUS) ×3
URONAV MRI FUSION TWO PATIENTS (MISCELLANEOUS) ×2 IMPLANT
URONAV ULTRASOUND (MISCELLANEOUS) ×2 IMPLANT
URONAV ULTRASOUND NDL GUIDE S (MISCELLANEOUS) ×3

## 2020-07-04 NOTE — Anesthesia Preprocedure Evaluation (Signed)
Anesthesia Evaluation  Patient identified by MRN, date of birth, ID band Patient awake    Reviewed: Allergy & Precautions, NPO status , Patient's Chart, lab work & pertinent test results  History of Anesthesia Complications Negative for: history of anesthetic complications  Airway Mallampati: II       Dental   Pulmonary neg sleep apnea, neg COPD, Patient abstained from smoking.Not current smoker,           Cardiovascular hypertension, Pt. on medications (-) Past MI and (-) CHF (-) dysrhythmias + Valvular Problems/Murmurs (murmur, no tx)      Neuro/Psych neg Seizures    GI/Hepatic Neg liver ROS, neg GERD  ,  Endo/Other  neg diabetes  Renal/GU negative Renal ROS     Musculoskeletal   Abdominal   Peds  Hematology   Anesthesia Other Findings   Reproductive/Obstetrics                             Anesthesia Physical Anesthesia Plan  ASA: II  Anesthesia Plan: General   Post-op Pain Management:    Induction: Intravenous  PONV Risk Score and Plan: 2 and Ondansetron and Dexamethasone  Airway Management Planned: LMA  Additional Equipment:   Intra-op Plan:   Post-operative Plan:   Informed Consent: I have reviewed the patients History and Physical, chart, labs and discussed the procedure including the risks, benefits and alternatives for the proposed anesthesia with the patient or authorized representative who has indicated his/her understanding and acceptance.       Plan Discussed with:   Anesthesia Plan Comments:         Anesthesia Quick Evaluation

## 2020-07-04 NOTE — Discharge Instructions (Addendum)
Transrectal Ultrasound-Guided Prostate Biopsy, Care After This sheet gives you information about how to care for yourself after your procedure. Your doctor may also give you more specific instructions. If you have problems or questions, contact your doctor. What can I expect after the procedure? After the procedure, it is common to have:  Pain and discomfort in your butt, especially while sitting.  Pink-colored pee (urine), due to small amounts of blood in the pee.  Burning while peeing (urinating).  Blood in your poop (stool).  Bleeding from your butt.  Blood in your semen. Follow these instructions at home: Medicines  Take over-the-counter and prescription medicines only as told by your doctor.  If you were prescribed antibiotic medicine, take it as told by your doctor. Do not stop taking the antibiotic even if you start to feel better. Activity   Do not drive for 24 hours if you were given a medicine to help you relax (sedative) during your procedure.  Return to your normal activities as told by your doctor. Ask your doctor what activities are safe for you.  Ask your doctor when it is okay for you to have sex.  Do not lift anything that is heavier than 10 lb (4.5 kg), or the limit that you are told, until your doctor says that it is safe. General instructions   Drink enough water to keep your pee pale yellow.  Watch your pee, poop, and semen for new bleeding or bleeding that gets worse.  Keep all follow-up visits as told by your doctor. This is important. Contact a doctor if you:  Have blood clots in your pee or poop.  Notice that your pee smells bad or unusual.  Have very bad belly pain.  Have trouble peeing.  Notice that your lower belly feels firm.  Have blood in your pee for more than 2 weeks after the procedure.  Have blood in your semen for more than 2 months after the procedure.  Have problems getting an erection.  Feel sick to your stomach  (nauseous).  Throw up (vomit).  Have new or worse bleeding in your pee, poop, or semen. Get help right away if you:  Have a fever or chills.  Have bright red pee.  Have very bad pain that does not get better with medicine.  Cannot pee. Summary  After this procedure, it is common to have pain and discomfort around your butt, especially while sitting.  You may have blood in your pee and poop.  It is common to have blood in your semen for 1-2 months.  If you were prescribed antibiotic medicine, take it as told by your doctor. Do not stop taking the antibiotic even if you start to feel better.  Get help right away if you have a fever or chills. This information is not intended to replace advice given to you by your health care provider. Make sure you discuss any questions you have with your health care provider. Document Revised: 12/28/2018 Document Reviewed: 07/06/2017 Elsevier Patient Education  2020 Elsevier Inc.   AMBULATORY SURGERY  DISCHARGE INSTRUCTIONS   1) The drugs that you were given will stay in your system until tomorrow so for the next 24 hours you should not:  A) Drive an automobile B) Make any legal decisions C) Drink any alcoholic beverage   2) You may resume regular meals tomorrow.  Today it is better to start with liquids and gradually work up to solid foods.  You may eat anything you prefer,   but it is better to start with liquids, then soup and crackers, and gradually work up to solid foods.   3) Please notify your doctor immediately if you have any unusual bleeding, trouble breathing, redness and pain at the surgery site, drainage, fever, or pain not relieved by medication.    4) Additional Instructions:        Please contact your physician with any problems or Same Day Surgery at [phone], Monday through Friday 6 am to 4 pm, or [place] at [facility] [place] number at [phone]. 

## 2020-07-04 NOTE — Transfer of Care (Signed)
Immediate Anesthesia Transfer of Care Note  Patient: Brett Crawford  Procedure(s) Performed: PROSTATE BIOPSY URONAV (N/A )  Patient Location: PACU  Anesthesia Type:General  Level of Consciousness: drowsy  Airway & Oxygen Therapy: Patient Spontanous Breathing and Patient connected to face mask oxygen  Post-op Assessment: Report given to RN  Post vital signs: stable  Last Vitals:  Vitals Value Taken Time  BP 134/90 07/04/20 1509  Temp    Pulse 67 07/04/20 1511  Resp 15 07/04/20 1511  SpO2 98 % 07/04/20 1511  Vitals shown include unvalidated device data.  Last Pain:  Vitals:   07/04/20 1235  TempSrc: Temporal  PainSc: 0-No pain         Complications: No complications documented.

## 2020-07-04 NOTE — Op Note (Signed)
Preoperative diagnosis: 1.  Prostate cancer with possible recurrence (C61)                                                2.  Elevated PSA (R97.2)                                             3.  Abnormal prostate MRI scan (D40.0)  Postoperative diagnosis: Same  Procedure: Uronav fusion guided prostate biopsy (CPT [id number], [id number])  Surgeon: Otelia Limes. Yves Dill MD  Anesthesia: General  Indications:See the history and physical. After informed consent the above procedure(s) were requested     Technique and findings: After adequate general anesthesia been obtained the patient was placed into left lateral decubitus position.  Digital examination indicated that the rectal vault was clear  Transrectal ultrasound probe was placed and prostate images acquired.  The ultrasound images were then fused with the MRI images.  The region of interest was identified and 4 core biopsies taken from this region.  At this point standard 12 core systematic biopsies were performed.  The ultrasound probe was removed.  Blood loss was minimal.  The procedure was then terminated and patient transferred to the recovery room in stable condition.

## 2020-07-04 NOTE — H&P (Signed)
Date of Initial H&P: 06/27/20 History reviewed, patient examined, no change in status, stable for surgery. 

## 2020-07-04 NOTE — Anesthesia Procedure Notes (Signed)
Procedure Name: LMA Insertion Date/Time: 07/04/2020 2:39 PM Performed by: Lerry Liner, CRNA Pre-anesthesia Checklist: Patient identified, Emergency Drugs available, Suction available and Patient being monitored Patient Re-evaluated:Patient Re-evaluated prior to induction Oxygen Delivery Method: Circle system utilized Preoxygenation: Pre-oxygenation with 100% oxygen Induction Type: IV induction Ventilation: Mask ventilation without difficulty LMA: LMA inserted LMA Size: 5.0 Number of attempts: 1 Placement Confirmation: positive ETCO2 and breath sounds checked- equal and bilateral Tube secured with: Tape Dental Injury: Teeth and Oropharynx as per pre-operative assessment

## 2020-07-05 ENCOUNTER — Encounter: Payer: Self-pay | Admitting: Urology

## 2020-07-08 NOTE — Anesthesia Postprocedure Evaluation (Signed)
Anesthesia Post Note  Patient: Brett Crawford  Procedure(s) Performed: PROSTATE BIOPSY URONAV (N/A )  Patient location during evaluation: PACU Anesthesia Type: General Level of consciousness: awake and alert and oriented Pain management: pain level controlled Vital Signs Assessment: post-procedure vital signs reviewed and stable Respiratory status: spontaneous breathing Cardiovascular status: blood pressure returned to baseline Anesthetic complications: no   No complications documented.   Last Vitals:  Vitals:   07/04/20 1540 07/04/20 1548  BP:  129/81  Pulse: 61 60  Resp: 11 16  Temp: (!) 36.3 C (!) 36.2 C  SpO2: 96% 97%    Last Pain:  Vitals:   07/05/20 1621  TempSrc:   PainSc: 0-No pain                 [name]

## 2020-07-09 LAB — SURGICAL PATHOLOGY

## 2020-07-22 ENCOUNTER — Encounter: Payer: Self-pay | Admitting: Radiation Oncology

## 2020-07-22 ENCOUNTER — Ambulatory Visit
Admission: RE | Admit: 2020-07-22 | Discharge: 2020-07-22 | Disposition: A | Discharge status: Home / Self Care | Payer: Medicare PPO | Source: Ambulatory Visit | Attending: Radiation Oncology | Admitting: Radiation Oncology

## 2020-07-22 ENCOUNTER — Other Ambulatory Visit: Payer: Self-pay

## 2020-07-22 VITALS — BP 134/81 | HR 66 | Temp 96.6°F | Resp 16 | Wt 198.6 lb

## 2020-07-22 DIAGNOSIS — C61 Malignant neoplasm of prostate: Secondary | ICD-10-CM | POA: Diagnosis present

## 2020-07-22 DIAGNOSIS — Z79899 Other long term (current) drug therapy: Secondary | ICD-10-CM | POA: Insufficient documentation

## 2020-07-22 DIAGNOSIS — E785 Hyperlipidemia, unspecified: Secondary | ICD-10-CM | POA: Insufficient documentation

## 2020-07-22 DIAGNOSIS — R011 Cardiac murmur, unspecified: Secondary | ICD-10-CM | POA: Diagnosis not present

## 2020-07-22 DIAGNOSIS — Z923 Personal history of irradiation: Secondary | ICD-10-CM | POA: Diagnosis not present

## 2020-07-22 DIAGNOSIS — I10 Essential (primary) hypertension: Secondary | ICD-10-CM | POA: Insufficient documentation

## 2020-07-22 NOTE — Consult Note (Signed)
NEW PATIENT EVALUATION  Name: Brett Crawford  MRN: [id number]  Date:   07/22/2020     DOB: [birth year]   This 69 y.o. male patient presents to the clinic for initial evaluation of adenocarcinoma the prostate status post HIFU x2 for Gleason seven (3+4) adenocarcinoma now with rising PSA.  REFERRING PHYSICIAN: Renee Rival, NP  CHIEF COMPLAINT:  Chief Complaint  Patient presents with  . Prostate Cancer    Initial consultation    DIAGNOSIS: The encounter diagnosis was Prostate cancer ([place]).   PREVIOUS INVESTIGATIONS:  Pathology report reviewed Clinical notes reviewed PET CT scan as well as MRI of prostate reviewed  HPI: Patient is a 69 year old male initially treated back in 2008 for adenocarcinoma the prostate with HIFU.  He had biochemical progression underwent repeat biopsy in 2018 for Mostly a Gleason seven (3+4 adenocarcinoma also Gleason six (3+3).  His PSA again has continued to climb most recently back in August was 5.5.  Patient underwent Axummin  PET CT back in February showing no evidence to suggest recurrent or metastatic disease.  He underwent an MRI scan of his prostate in September showing small focus of hyperenhancement left posterior apex within the prostate bed suspicious for disease.  No evidence of extracapsular disease or pelvic lymphadenopathy was noted.  He recently underwent MRI guided repeat biopsy showing Gleason seven (3+4) adenocarcinoma in the left apex which was the region of interest.  Patient states he has very little side effects.  Specifically denies urgency frequency or nocturia.  He also states he has no problems with erectile dysfunction.  He is now referred to [facility] for consideration of salvage treatment.    PLANNED TREATMENT REGIMEN: Image guided IMRT radiation therapy  PAST MEDICAL HISTORY:  has a past medical history of Atypical chest pain, Cancer ([place]) (2008), Hyperlipidemia, Hypertension, Murmur, and T wave inversion on  electrocardiogram.    PAST SURGICAL HISTORY:  Past Surgical History:  Procedure Laterality Date  . COLONOSCOPY  2011 ?  Marland Kitchen EYE SURGERY Right   . HERNIA REPAIR  11/18/2011   ventral / Dr Bary Castilla  . high intesnty focused ultrasound ( HIFU)  2008 and 2018  . INGUINAL HERNIA REPAIR Right 05/25/2017   Indirect hernia repaired with medium Ultra Pro mesh  Surgeon: Marlee Bellow, MD;  Location: [facility] ORS;  Service: General;  Laterality: Right;  . PROSTATE BIOPSY N/A 11/03/2016   Procedure: PROSTATE BIOPSY;  Surgeon: Royston Cowper, MD;  Location: [facility] ORS;  Service: Urology;  Laterality: N/A;  . PROSTATE BIOPSY N/A 07/04/2020   Procedure: PROSTATE BIOPSY URONAV;  Surgeon: Royston Cowper, MD;  Location: [facility] ORS;  Service: Urology;  Laterality: N/A;  . UMBILICAL HERNIA REPAIR  04/2011   Dr Bary Castilla    FAMILY HISTORY: family history includes Stroke in his father.  SOCIAL HISTORY:  reports that he has never smoked. He has never used smokeless tobacco. He reports current alcohol use. He reports that he does not use drugs.  ALLERGIES: Patient has no known allergies.  MEDICATIONS:  Current Outpatient Medications  Medication Sig Dispense Refill  . amLODipine (NORVASC) 10 MG tablet Take 10 mg by mouth every morning.     . finasteride (PROSCAR) 5 MG tablet Take 5 mg by mouth every morning.     . hydroxypropyl methylcellulose / hypromellose (ISOPTO TEARS / GONIOVISC) 2.5 % ophthalmic solution Place 1 drop into both eyes 3 (three) times daily as needed for dry eyes.    . naproxen sodium (ANAPROX) 220  MG tablet Take 440 mg by mouth daily as needed (pain).    . simvastatin (ZOCOR) 20 MG tablet Take 20 mg by mouth every morning.      No current facility-administered medications for this encounter.    ECOG PERFORMANCE STATUS:  0 - Asymptomatic  REVIEW OF SYSTEMS: Patient denies any weight loss, fatigue, weakness, fever, chills or night sweats. Patient denies any loss of vision, blurred vision.  Patient denies any ringing  of the ears or hearing loss. No irregular heartbeat. Patient denies heart murmur or history of fainting. Patient denies any chest pain or pain radiating to her upper extremities. Patient denies any shortness of breath, difficulty breathing at night, cough or hemoptysis. Patient denies any swelling in the lower legs. Patient denies any nausea vomiting, vomiting of blood, or coffee ground material in the vomitus. Patient denies any stomach pain. Patient states has had normal bowel movements no significant constipation or diarrhea. Patient denies any dysuria, hematuria or significant nocturia. Patient denies any problems walking, swelling in the joints or loss of balance. Patient denies any skin changes, loss of hair or loss of weight. Patient denies any excessive worrying or anxiety or significant depression. Patient denies any problems with insomnia. Patient denies excessive thirst, polyuria, polydipsia. Patient denies any swollen glands, patient denies easy bruising or easy bleeding. Patient denies any recent infections, allergies or URI. Patient "s visual fields have not changed significantly in recent time.   PHYSICAL EXAM: BP 134/81 (BP Location: Left Arm, Patient Position: Sitting)   Pulse 66   Temp (!) 96.6 F (35.9 C) (Tympanic)   Resp 16   Wt 198 lb 9.6 oz (90.1 kg)   BMI 30.65 kg/m  Well-developed well-nourished patient in NAD. HEENT reveals PERLA, EOMI, discs not visualized.  Oral cavity is clear. No oral mucosal lesions are identified. Neck is clear without evidence of cervical or supraclavicular adenopathy. Lungs are clear to A&P. Cardiac examination is essentially unremarkable with regular rate and rhythm without murmur rub or thrill. Abdomen is benign with no organomegaly or masses noted. Motor sensory and DTR levels are equal and symmetric in the upper and lower extremities. Cranial nerves II through XII are grossly intact. Proprioception is intact. No peripheral  adenopathy or edema is identified. No motor or sensory levels are noted. Crude visual fields are within normal range.  LABORATORY DATA: Pathology report reviewed    RADIOLOGY RESULTS: PET CT scan and MRI scans reviewed compatible with above-stated findings   IMPRESSION: Progressive prostate cancer in patient status post HIFU x22 in 69 year old male  PLAN: At this time I recommended radiation therapy to his prostate.  Would plan on delivering eighty Gray over 8 weeks using image guided radiation therapy.  I have asked Dr. Eliberto Ivory to place fiducial markers in his prostate for daily image guided treatment.  Risks and benefits of treatment including increased lower urinary tract symptoms such as frequency urgency and nocturia which may be especially problematic based on his HIFU x2 as well as possibility of erectile dysfunction fatigue alteration blood count skin reaction as well as diarrhea all were discussed in detail with the patient.  He seems to comprehend my treatment plan well.  I have asked Dr. Eliberto Ivory to place his markers and set up CT CT simulation a few days later.  Patient comprehends my recommendations well.  I would like to take this opportunity to thank you for allowing me to participate in the care of your patient.Noreene Filbert, MD

## 2020-08-01 ENCOUNTER — Ambulatory Visit: Payer: Medicare PPO

## 2020-08-05 ENCOUNTER — Ambulatory Visit: Admission: RE | Admit: 2020-08-05 | Payer: Medicare PPO | Source: Ambulatory Visit

## 2020-08-05 DIAGNOSIS — Z510 Encounter for antineoplastic radiation therapy: Secondary | ICD-10-CM | POA: Insufficient documentation

## 2020-08-05 DIAGNOSIS — C61 Malignant neoplasm of prostate: Secondary | ICD-10-CM | POA: Insufficient documentation

## 2020-08-06 DIAGNOSIS — Z510 Encounter for antineoplastic radiation therapy: Secondary | ICD-10-CM | POA: Diagnosis not present

## 2020-08-09 ENCOUNTER — Other Ambulatory Visit: Payer: Self-pay | Admitting: *Deleted

## 2020-08-09 DIAGNOSIS — C61 Malignant neoplasm of prostate: Secondary | ICD-10-CM

## 2020-08-13 ENCOUNTER — Ambulatory Visit: Admission: RE | Admit: 2020-08-13 | Payer: Medicare PPO | Source: Ambulatory Visit

## 2020-08-14 ENCOUNTER — Ambulatory Visit: Payer: Medicare PPO

## 2020-08-19 ENCOUNTER — Ambulatory Visit
Admission: RE | Admit: 2020-08-19 | Discharge: 2020-08-19 | Disposition: A | Discharge status: Home / Self Care | Payer: Medicare PPO | Source: Ambulatory Visit | Attending: Radiation Oncology | Admitting: Radiation Oncology

## 2020-08-19 DIAGNOSIS — Z510 Encounter for antineoplastic radiation therapy: Secondary | ICD-10-CM | POA: Diagnosis not present

## 2020-08-20 ENCOUNTER — Ambulatory Visit
Admission: RE | Admit: 2020-08-20 | Discharge: 2020-08-20 | Disposition: A | Discharge status: Home / Self Care | Payer: Medicare PPO | Source: Ambulatory Visit | Attending: Radiation Oncology | Admitting: Radiation Oncology

## 2020-08-20 DIAGNOSIS — Z510 Encounter for antineoplastic radiation therapy: Secondary | ICD-10-CM | POA: Diagnosis not present

## 2020-08-21 ENCOUNTER — Ambulatory Visit
Admission: RE | Admit: 2020-08-21 | Discharge: 2020-08-21 | Disposition: A | Discharge status: Home / Self Care | Payer: Medicare PPO | Source: Ambulatory Visit | Attending: Radiation Oncology | Admitting: Radiation Oncology

## 2020-08-21 DIAGNOSIS — Z510 Encounter for antineoplastic radiation therapy: Secondary | ICD-10-CM | POA: Diagnosis not present

## 2020-08-21 DIAGNOSIS — C61 Malignant neoplasm of prostate: Secondary | ICD-10-CM | POA: Diagnosis not present

## 2020-08-22 ENCOUNTER — Ambulatory Visit
Admission: RE | Admit: 2020-08-22 | Discharge: 2020-08-22 | Disposition: A | Discharge status: Home / Self Care | Payer: Medicare PPO | Source: Ambulatory Visit | Attending: Radiation Oncology | Admitting: Radiation Oncology

## 2020-08-23 ENCOUNTER — Ambulatory Visit
Admission: RE | Admit: 2020-08-23 | Discharge: 2020-08-23 | Disposition: A | Discharge status: Home / Self Care | Payer: Medicare PPO | Source: Ambulatory Visit | Attending: Radiation Oncology | Admitting: Radiation Oncology

## 2020-08-26 ENCOUNTER — Ambulatory Visit
Admission: RE | Admit: 2020-08-26 | Discharge: 2020-08-26 | Disposition: A | Discharge status: Home / Self Care | Payer: Medicare PPO | Source: Ambulatory Visit | Attending: Radiation Oncology | Admitting: Radiation Oncology

## 2020-08-27 ENCOUNTER — Ambulatory Visit
Admission: RE | Admit: 2020-08-27 | Discharge: 2020-08-27 | Disposition: A | Discharge status: Home / Self Care | Payer: Medicare PPO | Source: Ambulatory Visit | Attending: Radiation Oncology | Admitting: Radiation Oncology

## 2020-08-28 ENCOUNTER — Ambulatory Visit
Admission: RE | Admit: 2020-08-28 | Discharge: 2020-08-28 | Disposition: A | Discharge status: Home / Self Care | Payer: Medicare PPO | Source: Ambulatory Visit | Attending: Radiation Oncology | Admitting: Radiation Oncology

## 2020-08-29 ENCOUNTER — Ambulatory Visit
Admission: RE | Admit: 2020-08-29 | Discharge: 2020-08-29 | Disposition: A | Discharge status: Home / Self Care | Payer: Medicare PPO | Source: Ambulatory Visit | Attending: Radiation Oncology | Admitting: Radiation Oncology

## 2020-08-30 ENCOUNTER — Ambulatory Visit
Admission: RE | Admit: 2020-08-30 | Discharge: 2020-08-30 | Disposition: A | Discharge status: Home / Self Care | Payer: Medicare PPO | Source: Ambulatory Visit | Attending: Radiation Oncology | Admitting: Radiation Oncology

## 2020-09-02 ENCOUNTER — Ambulatory Visit
Admission: RE | Admit: 2020-09-02 | Discharge: 2020-09-02 | Disposition: A | Discharge status: Home / Self Care | Payer: Medicare PPO | Source: Ambulatory Visit | Attending: Radiation Oncology | Admitting: Radiation Oncology

## 2020-09-02 ENCOUNTER — Inpatient Hospital Stay: Payer: Medicare PPO | Attending: Radiation Oncology

## 2020-09-02 ENCOUNTER — Other Ambulatory Visit: Payer: Self-pay

## 2020-09-02 DIAGNOSIS — C61 Malignant neoplasm of prostate: Secondary | ICD-10-CM | POA: Insufficient documentation

## 2020-09-02 LAB — CBC
HCT: 40.9 % (ref 39.0–52.0)
Hemoglobin: 13.5 g/dL (ref 13.0–17.0)
MCH: 28.2 pg (ref 26.0–34.0)
MCHC: 33 g/dL (ref 30.0–36.0)
MCV: 85.6 fL (ref 80.0–100.0)
Platelets: 211 10*3/uL (ref 150–400)
RBC: 4.78 MIL/uL (ref 4.22–5.81)
RDW: 14.5 % (ref 11.5–15.5)
WBC: 4 10*3/uL (ref 4.0–10.5)
nRBC: 0 % (ref 0.0–0.2)

## 2020-09-03 ENCOUNTER — Ambulatory Visit
Admission: RE | Admit: 2020-09-03 | Discharge: 2020-09-03 | Disposition: A | Discharge status: Home / Self Care | Payer: Medicare PPO | Source: Ambulatory Visit | Attending: Radiation Oncology | Admitting: Radiation Oncology

## 2020-09-04 ENCOUNTER — Ambulatory Visit
Admission: RE | Admit: 2020-09-04 | Discharge: 2020-09-04 | Disposition: A | Discharge status: Home / Self Care | Payer: Medicare PPO | Source: Ambulatory Visit | Attending: Radiation Oncology | Admitting: Radiation Oncology

## 2020-09-05 ENCOUNTER — Ambulatory Visit
Admission: RE | Admit: 2020-09-05 | Discharge: 2020-09-05 | Disposition: A | Discharge status: Home / Self Care | Payer: Medicare PPO | Source: Ambulatory Visit | Attending: Radiation Oncology | Admitting: Radiation Oncology

## 2020-09-06 ENCOUNTER — Ambulatory Visit
Admission: RE | Admit: 2020-09-06 | Discharge: 2020-09-06 | Disposition: A | Discharge status: Home / Self Care | Payer: Medicare PPO | Source: Ambulatory Visit | Attending: Radiation Oncology | Admitting: Radiation Oncology

## 2020-09-09 ENCOUNTER — Ambulatory Visit: Payer: Medicare PPO

## 2020-09-10 ENCOUNTER — Ambulatory Visit
Admission: RE | Admit: 2020-09-10 | Discharge: 2020-09-10 | Disposition: A | Discharge status: Home / Self Care | Payer: Medicare PPO | Source: Ambulatory Visit | Attending: Radiation Oncology | Admitting: Radiation Oncology

## 2020-09-10 ENCOUNTER — Emergency Department: Admission: EM | Admit: 2020-09-10 | Discharge: 2020-09-10 | Discharge status: Absent without leave | Payer: Medicare PPO

## 2020-09-11 ENCOUNTER — Ambulatory Visit
Admission: RE | Admit: 2020-09-11 | Discharge: 2020-09-11 | Disposition: A | Discharge status: Home / Self Care | Payer: Medicare PPO | Source: Ambulatory Visit | Attending: Radiation Oncology | Admitting: Radiation Oncology

## 2020-09-12 ENCOUNTER — Encounter: Payer: Self-pay | Admitting: Emergency Medicine

## 2020-09-12 ENCOUNTER — Ambulatory Visit
Admission: RE | Admit: 2020-09-12 | Discharge: 2020-09-12 | Disposition: A | Discharge status: Home / Self Care | Payer: Medicare PPO | Source: Ambulatory Visit | Attending: Radiation Oncology | Admitting: Radiation Oncology

## 2020-09-12 ENCOUNTER — Emergency Department: Payer: Medicare PPO

## 2020-09-12 ENCOUNTER — Emergency Department
Admission: EM | Admit: 2020-09-12 | Discharge: 2020-09-12 | Disposition: A | Discharge status: Home / Self Care | Payer: Medicare PPO | Source: Home / Self Care | Attending: Emergency Medicine | Admitting: Emergency Medicine

## 2020-09-12 ENCOUNTER — Other Ambulatory Visit: Payer: Self-pay

## 2020-09-12 DIAGNOSIS — I10 Essential (primary) hypertension: Secondary | ICD-10-CM | POA: Insufficient documentation

## 2020-09-12 DIAGNOSIS — R0789 Other chest pain: Secondary | ICD-10-CM | POA: Insufficient documentation

## 2020-09-12 DIAGNOSIS — Z79899 Other long term (current) drug therapy: Secondary | ICD-10-CM | POA: Insufficient documentation

## 2020-09-12 DIAGNOSIS — Z8546 Personal history of malignant neoplasm of prostate: Secondary | ICD-10-CM | POA: Insufficient documentation

## 2020-09-12 DIAGNOSIS — M79602 Pain in left arm: Secondary | ICD-10-CM | POA: Insufficient documentation

## 2020-09-12 LAB — BASIC METABOLIC PANEL
Anion gap: 8 (ref 5–15)
BUN: 17 mg/dL (ref 8–23)
CO2: 26 mmol/L (ref 22–32)
Calcium: 8.9 mg/dL (ref 8.9–10.3)
Chloride: 104 mmol/L (ref 98–111)
Creatinine, Ser: 0.82 mg/dL (ref 0.61–1.24)
GFR, Estimated: >60 mL/min (ref >60)
Glucose, Bld: 100 mg/dL — ABNORMAL HIGH (ref 70–99)
Potassium: 4 mmol/L (ref 3.5–5.1)
Sodium: 138 mmol/L (ref 135–145)

## 2020-09-12 LAB — CBC
HCT: 43.2 % (ref 39.0–52.0)
Hemoglobin: 14 g/dL (ref 13.0–17.0)
MCH: 28.2 pg (ref 26.0–34.0)
MCHC: 32.4 g/dL (ref 30.0–36.0)
MCV: 86.9 fL (ref 80.0–100.0)
Platelets: 202 10*3/uL (ref 150–400)
RBC: 4.97 MIL/uL (ref 4.22–5.81)
RDW: 14.8 % (ref 11.5–15.5)
WBC: 3.7 10*3/uL — ABNORMAL LOW (ref 4.0–10.5)
nRBC: 0 % (ref 0.0–0.2)

## 2020-09-12 LAB — TROPONIN I (HIGH SENSITIVITY): Troponin I (High Sensitivity): 2 ng/L (ref <18)

## 2020-09-12 NOTE — ED Notes (Signed)
ED Provider at bedside. 

## 2020-09-12 NOTE — ED Triage Notes (Signed)
Pt reports for the last 2 weeks has had a discomfort in his mid chest. Pt denies pain that radiates or SOB, just states it is a discomfort feeling.

## 2020-09-12 NOTE — ED Notes (Signed)
No e-sig obtained due to patient being in the hallway and no e-signature pad available.  Pt verbalized understanding of all discharge instructions and f/u care.

## 2020-09-12 NOTE — ED Provider Notes (Addendum)
[facility] Emergency Department Provider Note   ____________________________________________   Event Date/Time   First MD Initiated Contact with Patient 09/12/20 1311     (approximate)  I have reviewed the triage vital signs and the nursing notes.   HISTORY  Chief Complaint Chest Pain    HPI Brett Crawford is a 69 y.o. male with a stated past medical history of prostate cancer, hypertension, and recurrent costochondritis who presents for midsternal chest pain that he describes as aching, 5/10, stable since onset, and occasionally radiating to his left arm.  Patient states this pain is similar to when he has had costochondritis in the past but it did not last this long.  Patient denies any exacerbating or relieving factors.  Patient states that he has not tried taking any medications for this pain.  Patient denies any associated vision changes, weakness/numbness/paresthesias in any extremity, shortness of breath, dyspnea on exertion, diaphoresis, or swelling in the hands or feet.         Past Medical History:  Diagnosis Date  . Atypical chest pain   . Cancer [facility]) 2008   prostate  . Hyperlipidemia   . Hypertension   . Murmur    ASYMPTOMATIC PER PT  . T wave inversion on electrocardiogram    anterolateral    Patient Active Problem List   Diagnosis Date Noted  . Right inguinal hernia 10/29/2016    Past Surgical History:  Procedure Laterality Date  . COLONOSCOPY  2011 ?  Marland Kitchen EYE SURGERY Right   . HERNIA REPAIR  11/18/2011   ventral / Dr Lemar Livings  . high intesnty focused ultrasound ( HIFU)  2008 and 2018  . INGUINAL HERNIA REPAIR Right 05/25/2017   Indirect hernia repaired with medium Ultra Pro mesh  Surgeon: Earline Mayotte, MD;  Location: [facility] ORS;  Service: General;  Laterality: Right;  . PROSTATE BIOPSY N/A 11/03/2016   Procedure: PROSTATE BIOPSY;  Surgeon: Orson Ape, MD;  Location: [facility] ORS;  Service: Urology;  Laterality: N/A;  .  PROSTATE BIOPSY N/A 07/04/2020   Procedure: PROSTATE BIOPSY URONAV;  Surgeon: Orson Ape, MD;  Location: [facility] ORS;  Service: Urology;  Laterality: N/A;  . UMBILICAL HERNIA REPAIR  04/2011   Dr Lemar Livings    Prior to Admission medications   Medication Sig Start Date End Date Taking? Authorizing Provider  amLODipine (NORVASC) 10 MG tablet Take 10 mg by mouth every morning.     [provider]  finasteride (PROSCAR) 5 MG tablet Take 5 mg by mouth every morning.     [provider]  hydroxypropyl methylcellulose / hypromellose (ISOPTO TEARS / GONIOVISC) 2.5 % ophthalmic solution Place 1 drop into both eyes 3 (three) times daily as needed for dry eyes.    [provider]  naproxen sodium (ANAPROX) 220 MG tablet Take 440 mg by mouth daily as needed (pain).    [provider]  simvastatin (ZOCOR) 20 MG tablet Take 20 mg by mouth every morning.     [provider]    Allergies Patient has no known allergies.  Family History  Problem Relation Age of Onset  . Stroke Father     Social History Social History   Tobacco Use  . Smoking status: Never Smoker  . Smokeless tobacco: Never Used  Vaping Use  . Vaping Use: Never used  Substance Use Topics  . Alcohol use: Yes    Comment: BEER OR WINE DAILY  . Drug use: No  Review of Systems Constitutional: No fever/chills Eyes: No visual changes. ENT: No sore throat. Cardiovascular: Endorses chest pain. Respiratory: Denies shortness of breath. Gastrointestinal: No abdominal pain.  No nausea, no vomiting.  No diarrhea. Genitourinary: Negative for dysuria. Musculoskeletal: Negative for acute arthralgias Skin: Negative for rash. Neurological: Negative for headaches, weakness/numbness/paresthesias in any extremity Psychiatric: Negative for suicidal ideation/homicidal ideation   ____________________________________________   PHYSICAL EXAM:  VITAL SIGNS: ED Triage Vitals  Enc Vitals Group      BP 09/12/20 0934 139/80     Pulse Rate 09/12/20 0934 76     Resp 09/12/20 0934 16     Temp 09/12/20 0934 98.8 F (37.1 C)     Temp Source 09/12/20 0934 Oral     SpO2 09/12/20 0934 98 %     Weight 09/12/20 0929 194 lb (88 kg)     Height 09/12/20 0929 5\' 7"  (1.702 m)     Head Circumference --      Peak Flow --      Pain Score 09/12/20 0929 5     Pain Loc --      Pain Edu? --      Excl. in [place]? --    Constitutional: Alert and oriented. Well appearing and in no acute distress. Eyes: Conjunctivae are normal. PERRL. Head: Atraumatic. Nose: No congestion/rhinnorhea. Mouth/Throat: Mucous membranes are moist. Neck: No stridor Cardiovascular: Grossly normal heart sounds.  Good peripheral circulation. Respiratory: Normal respiratory effort.  No retractions. Gastrointestinal: Soft and nontender. No distention. Musculoskeletal: No obvious deformities Neurologic:  Normal speech and language. No gross focal neurologic deficits are appreciated. Skin:  Skin is warm and dry. No rash noted. Psychiatric: Mood and affect are normal. Speech and behavior are normal.  ____________________________________________   LABS (all labs ordered are listed, but only abnormal results are displayed)  Labs Reviewed  BASIC METABOLIC PANEL - Abnormal; Notable for the following components:      Result Value   Glucose, Bld 100 (*)    All other components within normal limits  CBC - Abnormal; Notable for the following components:   WBC 3.7 (*)    All other components within normal limits  TROPONIN I (HIGH SENSITIVITY)  TROPONIN I (HIGH SENSITIVITY)   ____________________________________________  EKG  ED ECG REPORT I, Naaman Plummer, the attending physician, personally viewed and interpreted this ECG.  Date: 09/12/2020 EKG Time: 0930 Rate: 64 Rhythm: normal sinus rhythm QRS Axis: normal Intervals: normal ST/T Wave abnormalities: normal Narrative Interpretation: no evidence of acute  ischemia  ____________________________________________  RADIOLOGY  ED MD interpretation: 2 view x-ray of the chest shows no evidence of acute abnormalities including no obvious metastasis, pneumonia, pneumothorax, or widened mediastinum.  Official radiology report(s): DG Chest 2 View  Result Date: 09/12/2020 CLINICAL DATA:  Chest pain.  Prostate carcinoma EXAM: CHEST - 2 VIEW COMPARISON:  Chest radiograph October 04, 2018; PET-CT November 09, 2019 FINDINGS: Lungs are clear. Heart size and pulmonary vascularity are normal. No adenopathy. No bone lesions. No pneumothorax. IMPRESSION: No neoplastic focus evident.  Lungs clear.  No adenopathy. Electronically Signed   By: Lowella Grip III M.D.   On: 09/12/2020 09:49    ____________________________________________   PROCEDURES  Procedure(s) performed (including Critical Care):  .1-3 Lead EKG Interpretation Performed by: Naaman Plummer, MD Authorized by: Naaman Plummer, MD     Interpretation: normal     ECG rate:  67   ECG rate assessment: normal     Rhythm: sinus rhythm  Ectopy: none     Conduction: normal       ____________________________________________   INITIAL IMPRESSION / ASSESSMENT AND PLAN / ED COURSE  As part of my medical decision making, I reviewed the following data within the [facility] notes reviewed and incorporated, Labs reviewed, EKG interpreted, Old chart reviewed, Radiograph reviewed and Notes from prior ED visits reviewed and incorporated        Workup: ECG, CXR, CBC, BMP, Troponin Findings: ECG: No overt evidence of STEMI. No evidence of Brugadas sign, delta wave, epsilon wave, significantly prolonged QTc, or malignant arrhythmia HS Troponin: Negative x1 Other Labs unremarkable for emergent problems. CXR: Without PTX, PNA, or widened mediastinum Last Stress Test: 2 years prior to arrival Last Heart Catheterization: 2 years prior to arrival HEART Score:  3  Given History, Exam, and Workup I have low suspicion for ACS, Pneumothorax, Pneumonia, Pulmonary Embolus, Tamponade, Aortic Dissection or other emergent problem as a cause for this presentation.   Reassesment: Prior to discharge patients pain was controlled and they were well appearing.  Disposition:  Discharge. Strict return precautions discussed with patient with full understanding. Advised patient to follow up promptly with primary care provider     ____________________________________________   FINAL CLINICAL IMPRESSION(S) / ED DIAGNOSES  Final diagnoses:  Chest wall pain     ED Discharge Orders    None       Note:  This document was prepared using Dragon voice recognition software and may include unintentional dictation errors.   Naaman Plummer, MD 09/12/20 1330    Naaman Plummer, MD 09/12/20 1330

## 2020-09-16 ENCOUNTER — Inpatient Hospital Stay: Payer: Medicare PPO

## 2020-09-16 ENCOUNTER — Ambulatory Visit
Admission: RE | Admit: 2020-09-16 | Discharge: 2020-09-16 | Disposition: A | Discharge status: Home / Self Care | Payer: Medicare PPO | Source: Ambulatory Visit | Attending: Radiation Oncology | Admitting: Radiation Oncology

## 2020-09-17 ENCOUNTER — Ambulatory Visit
Admission: RE | Admit: 2020-09-17 | Discharge: 2020-09-17 | Disposition: A | Discharge status: Home / Self Care | Payer: Medicare PPO | Source: Ambulatory Visit | Attending: Radiation Oncology | Admitting: Radiation Oncology

## 2020-09-18 ENCOUNTER — Ambulatory Visit
Admission: RE | Admit: 2020-09-18 | Discharge: 2020-09-18 | Disposition: A | Discharge status: Home / Self Care | Payer: Medicare PPO | Source: Ambulatory Visit | Attending: Radiation Oncology | Admitting: Radiation Oncology

## 2020-09-19 ENCOUNTER — Ambulatory Visit
Admission: RE | Admit: 2020-09-19 | Discharge: 2020-09-19 | Disposition: A | Discharge status: Home / Self Care | Payer: Medicare PPO | Source: Ambulatory Visit | Attending: Radiation Oncology | Admitting: Radiation Oncology

## 2020-09-23 ENCOUNTER — Ambulatory Visit
Admission: RE | Admit: 2020-09-23 | Discharge: 2020-09-23 | Disposition: A | Discharge status: Home / Self Care | Payer: Medicare PPO | Source: Ambulatory Visit | Attending: Radiation Oncology | Admitting: Radiation Oncology

## 2020-09-23 DIAGNOSIS — C61 Malignant neoplasm of prostate: Secondary | ICD-10-CM | POA: Diagnosis not present

## 2020-09-23 DIAGNOSIS — Z510 Encounter for antineoplastic radiation therapy: Secondary | ICD-10-CM | POA: Insufficient documentation

## 2020-09-24 ENCOUNTER — Ambulatory Visit
Admission: RE | Admit: 2020-09-24 | Discharge: 2020-09-24 | Disposition: A | Discharge status: Home / Self Care | Payer: Medicare PPO | Source: Ambulatory Visit | Attending: Radiation Oncology | Admitting: Radiation Oncology

## 2020-09-24 DIAGNOSIS — Z510 Encounter for antineoplastic radiation therapy: Secondary | ICD-10-CM | POA: Diagnosis not present

## 2020-09-25 ENCOUNTER — Ambulatory Visit
Admission: RE | Admit: 2020-09-25 | Discharge: 2020-09-25 | Disposition: A | Discharge status: Home / Self Care | Payer: Medicare PPO | Source: Ambulatory Visit | Attending: Radiation Oncology | Admitting: Radiation Oncology

## 2020-09-25 DIAGNOSIS — Z510 Encounter for antineoplastic radiation therapy: Secondary | ICD-10-CM | POA: Diagnosis not present

## 2020-09-26 ENCOUNTER — Ambulatory Visit
Admission: RE | Admit: 2020-09-26 | Discharge: 2020-09-26 | Disposition: A | Discharge status: Home / Self Care | Payer: Medicare PPO | Source: Ambulatory Visit | Attending: Radiation Oncology | Admitting: Radiation Oncology

## 2020-09-26 DIAGNOSIS — Z510 Encounter for antineoplastic radiation therapy: Secondary | ICD-10-CM | POA: Diagnosis not present

## 2020-09-27 ENCOUNTER — Ambulatory Visit
Admission: RE | Admit: 2020-09-27 | Discharge: 2020-09-27 | Disposition: A | Discharge status: Home / Self Care | Payer: Medicare PPO | Source: Ambulatory Visit | Attending: Radiation Oncology | Admitting: Radiation Oncology

## 2020-09-27 DIAGNOSIS — Z510 Encounter for antineoplastic radiation therapy: Secondary | ICD-10-CM | POA: Diagnosis not present

## 2020-09-30 ENCOUNTER — Inpatient Hospital Stay: Payer: Medicare PPO | Attending: Radiation Oncology

## 2020-09-30 ENCOUNTER — Other Ambulatory Visit: Payer: Self-pay

## 2020-09-30 ENCOUNTER — Ambulatory Visit
Admission: RE | Admit: 2020-09-30 | Discharge: 2020-09-30 | Disposition: A | Discharge status: Home / Self Care | Payer: Medicare PPO | Source: Ambulatory Visit | Attending: Radiation Oncology | Admitting: Radiation Oncology

## 2020-09-30 DIAGNOSIS — C61 Malignant neoplasm of prostate: Secondary | ICD-10-CM | POA: Insufficient documentation

## 2020-09-30 DIAGNOSIS — Z510 Encounter for antineoplastic radiation therapy: Secondary | ICD-10-CM | POA: Diagnosis not present

## 2020-09-30 LAB — CBC
HCT: 41 % (ref 39.0–52.0)
Hemoglobin: 13.8 g/dL (ref 13.0–17.0)
MCH: 28.6 pg (ref 26.0–34.0)
MCHC: 33.7 g/dL (ref 30.0–36.0)
MCV: 85.1 fL (ref 80.0–100.0)
Platelets: 208 10*3/uL (ref 150–400)
RBC: 4.82 MIL/uL (ref 4.22–5.81)
RDW: 14.6 % (ref 11.5–15.5)
WBC: 3.4 10*3/uL — ABNORMAL LOW (ref 4.0–10.5)
nRBC: 0 % (ref 0.0–0.2)

## 2020-10-01 ENCOUNTER — Ambulatory Visit
Admission: RE | Admit: 2020-10-01 | Discharge: 2020-10-01 | Disposition: A | Discharge status: Home / Self Care | Payer: Medicare PPO | Source: Ambulatory Visit | Attending: Radiation Oncology | Admitting: Radiation Oncology

## 2020-10-01 DIAGNOSIS — Z510 Encounter for antineoplastic radiation therapy: Secondary | ICD-10-CM | POA: Diagnosis not present

## 2020-10-02 ENCOUNTER — Ambulatory Visit
Admission: RE | Admit: 2020-10-02 | Discharge: 2020-10-02 | Disposition: A | Discharge status: Home / Self Care | Payer: Medicare PPO | Source: Ambulatory Visit | Attending: Radiation Oncology | Admitting: Radiation Oncology

## 2020-10-02 DIAGNOSIS — Z510 Encounter for antineoplastic radiation therapy: Secondary | ICD-10-CM | POA: Diagnosis not present

## 2020-10-03 ENCOUNTER — Ambulatory Visit
Admission: RE | Admit: 2020-10-03 | Discharge: 2020-10-03 | Disposition: A | Discharge status: Home / Self Care | Payer: Medicare PPO | Source: Ambulatory Visit | Attending: Radiation Oncology | Admitting: Radiation Oncology

## 2020-10-03 DIAGNOSIS — Z510 Encounter for antineoplastic radiation therapy: Secondary | ICD-10-CM | POA: Diagnosis not present

## 2020-10-04 ENCOUNTER — Ambulatory Visit
Admission: RE | Admit: 2020-10-04 | Discharge: 2020-10-04 | Disposition: A | Discharge status: Home / Self Care | Payer: Medicare PPO | Source: Ambulatory Visit | Attending: Radiation Oncology | Admitting: Radiation Oncology

## 2020-10-04 DIAGNOSIS — Z510 Encounter for antineoplastic radiation therapy: Secondary | ICD-10-CM | POA: Diagnosis not present

## 2020-10-07 ENCOUNTER — Ambulatory Visit
Admission: RE | Admit: 2020-10-07 | Discharge: 2020-10-07 | Disposition: A | Discharge status: Home / Self Care | Payer: Medicare PPO | Source: Ambulatory Visit | Attending: Radiation Oncology | Admitting: Radiation Oncology

## 2020-10-07 DIAGNOSIS — Z510 Encounter for antineoplastic radiation therapy: Secondary | ICD-10-CM | POA: Diagnosis not present

## 2020-10-08 ENCOUNTER — Ambulatory Visit
Admission: RE | Admit: 2020-10-08 | Discharge: 2020-10-08 | Disposition: A | Discharge status: Home / Self Care | Payer: Medicare PPO | Source: Ambulatory Visit | Attending: Radiation Oncology | Admitting: Radiation Oncology

## 2020-10-08 DIAGNOSIS — Z510 Encounter for antineoplastic radiation therapy: Secondary | ICD-10-CM | POA: Diagnosis not present

## 2020-10-09 ENCOUNTER — Ambulatory Visit
Admission: RE | Admit: 2020-10-09 | Discharge: 2020-10-09 | Disposition: A | Discharge status: Home / Self Care | Payer: Medicare PPO | Source: Ambulatory Visit | Attending: Radiation Oncology | Admitting: Radiation Oncology

## 2020-10-09 DIAGNOSIS — Z510 Encounter for antineoplastic radiation therapy: Secondary | ICD-10-CM | POA: Diagnosis not present

## 2020-10-10 ENCOUNTER — Ambulatory Visit
Admission: RE | Admit: 2020-10-10 | Discharge: 2020-10-10 | Disposition: A | Discharge status: Home / Self Care | Payer: Medicare PPO | Source: Ambulatory Visit | Attending: Radiation Oncology | Admitting: Radiation Oncology

## 2020-10-10 DIAGNOSIS — Z510 Encounter for antineoplastic radiation therapy: Secondary | ICD-10-CM | POA: Diagnosis not present

## 2020-10-11 ENCOUNTER — Ambulatory Visit
Admission: RE | Admit: 2020-10-11 | Discharge: 2020-10-11 | Disposition: A | Discharge status: Home / Self Care | Payer: Medicare PPO | Source: Ambulatory Visit | Attending: Radiation Oncology | Admitting: Radiation Oncology

## 2020-10-11 DIAGNOSIS — Z510 Encounter for antineoplastic radiation therapy: Secondary | ICD-10-CM | POA: Diagnosis not present

## 2020-10-14 ENCOUNTER — Ambulatory Visit
Admission: RE | Admit: 2020-10-14 | Discharge: 2020-10-14 | Disposition: A | Discharge status: Home / Self Care | Payer: Medicare PPO | Source: Ambulatory Visit | Attending: Radiation Oncology | Admitting: Radiation Oncology

## 2020-10-14 DIAGNOSIS — Z510 Encounter for antineoplastic radiation therapy: Secondary | ICD-10-CM | POA: Diagnosis not present

## 2020-10-15 ENCOUNTER — Ambulatory Visit: Payer: Medicare PPO

## 2020-10-15 ENCOUNTER — Ambulatory Visit
Admission: RE | Admit: 2020-10-15 | Discharge: 2020-10-15 | Disposition: A | Discharge status: Home / Self Care | Payer: Medicare PPO | Source: Ambulatory Visit | Attending: Radiation Oncology | Admitting: Radiation Oncology

## 2020-10-15 DIAGNOSIS — Z510 Encounter for antineoplastic radiation therapy: Secondary | ICD-10-CM | POA: Diagnosis not present

## 2020-10-16 ENCOUNTER — Ambulatory Visit
Admission: RE | Admit: 2020-10-16 | Discharge: 2020-10-16 | Disposition: A | Discharge status: Home / Self Care | Payer: Medicare PPO | Source: Ambulatory Visit | Attending: Radiation Oncology | Admitting: Radiation Oncology

## 2020-10-16 DIAGNOSIS — Z510 Encounter for antineoplastic radiation therapy: Secondary | ICD-10-CM | POA: Diagnosis not present

## 2020-11-08 ENCOUNTER — Telehealth: Payer: Self-pay | Admitting: *Deleted

## 2020-11-08 NOTE — Telephone Encounter (Signed)
Patient requesting a return call about an insurance form and his visit with Dr Baruch Gouty

## 2020-11-21 ENCOUNTER — Other Ambulatory Visit: Payer: Self-pay | Admitting: Licensed Clinical Social Worker

## 2020-11-21 ENCOUNTER — Other Ambulatory Visit: Payer: Self-pay

## 2020-11-21 ENCOUNTER — Encounter: Payer: Self-pay | Admitting: Radiation Oncology

## 2020-11-21 ENCOUNTER — Ambulatory Visit
Admission: RE | Admit: 2020-11-21 | Discharge: 2020-11-21 | Disposition: A | Discharge status: Home / Self Care | Payer: Medicare PPO | Source: Ambulatory Visit | Attending: Radiation Oncology | Admitting: Radiation Oncology

## 2020-11-21 VITALS — BP 142/92 | HR 71 | Temp 97.1°F | Wt 193.0 lb

## 2020-11-21 DIAGNOSIS — C61 Malignant neoplasm of prostate: Secondary | ICD-10-CM

## 2020-11-21 NOTE — Progress Notes (Signed)
Radiation Oncology Follow up Note  Name: Brett Crawford   Date:   11/21/2020 MRN:  [id number] DOB: 01-Jul-1951    This 70 y.o. male presents to the clinic today for 1 month follow-up.  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  He has had prior cryotherapy.  He is seen today in routine follow-up is doing well.  He does have some burning on urination and I have suggested Azo for that or cranberry juice.  He is having no bowel problems whatsoever..  COMPLICATIONS OF TREATMENT: none  FOLLOW UP COMPLIANCE: keeps appointments   PHYSICAL EXAM:  BP (!) 142/92   Pulse 71   Temp (!) 97.1 F (36.2 C) (Tympanic)   Wt 193 lb (87.5 kg)   BMI 30.23 kg/m  Well-developed well-nourished patient in NAD. HEENT reveals PERLA, EOMI, discs not visualized.  Oral cavity is clear. No oral mucosal lesions are identified. Neck is clear without evidence of cervical or supraclavicular adenopathy. Lungs are clear to A&P. Cardiac examination is essentially unremarkable with regular rate and rhythm without murmur rub or thrill. Abdomen is benign with no organomegaly or masses noted. Motor sensory and DTR levels are equal and symmetric in the upper and lower extremities. Cranial nerves II through XII are grossly intact. Proprioception is intact. No peripheral adenopathy or edema is identified. No motor or sensory levels are noted. Crude visual fields are within normal range.  RADIOLOGY RESULTS: No current films to review  PLAN: Present time patient is a low side effect profile  Suggested cranberry juice is much as tolerated or Azo pills which she can get over-the-counter for the burning.  Otherwise am pleased with his overall progress.  I have asked to see him back in 3 months for follow-up with a PSA at that  time.  Patient knows to call with any concerns.  I would like to take this opportunity to thank you for allowing me to participate in the care of your patient.Noreene Filbert, MD

## 2020-12-06 ENCOUNTER — Telehealth: Payer: Self-pay | Admitting: *Deleted

## 2020-12-06 ENCOUNTER — Other Ambulatory Visit: Payer: Self-pay | Admitting: Licensed Clinical Social Worker

## 2020-12-06 DIAGNOSIS — C61 Malignant neoplasm of prostate: Secondary | ICD-10-CM

## 2020-12-06 MED ORDER — PHENAZOPYRIDINE HCL 100 MG PO TABS: 100.0000 mg | ORAL_TABLET | Freq: Three times a day (TID) | ORAL | 1 refills | Status: DC | PRN

## 2020-12-06 NOTE — Telephone Encounter (Signed)
Patient called stating that he is still having painful burning urination and that the AZO he was told to get is NOT helping. He is asking for something to be ordered and states that this has been going on for a month and he needs something done.

## 2020-12-17 ENCOUNTER — Encounter: Payer: Self-pay | Admitting: Radiation Oncology

## 2020-12-17 ENCOUNTER — Ambulatory Visit
Admission: RE | Admit: 2020-12-17 | Discharge: 2020-12-17 | Disposition: A | Discharge status: Home / Self Care | Payer: Medicare PPO | Source: Ambulatory Visit | Attending: Radiation Oncology | Admitting: Radiation Oncology

## 2021-02-13 ENCOUNTER — Inpatient Hospital Stay: Payer: Medicare PPO | Attending: Radiation Oncology

## 2021-02-20 ENCOUNTER — Inpatient Hospital Stay: Payer: Medicare PPO | Attending: Radiation Oncology

## 2021-02-20 ENCOUNTER — Ambulatory Visit: Payer: Medicare PPO | Admitting: Radiation Oncology

## 2021-08-29 IMAGING — MR MR PROSTATE WO/W CM
56 series · 56 of 56 positions shown · IV contrast (9ml Gadavist)
Comparison: Prostate MRI from 8132
COMPARISON: Prostate MRI from 8132

Addendum:
CLINICAL DATA: History of Gleason 6 disease treated with HIFU.
Biochemical recurrence.

EXAM:
MR PROSTATE WITHOUT AND WITH CONTRAST
TECHNIQUE: Multiplanar multisequence MRI images were obtained of the pelvis
centered about the prostate. Pre and post contrast images were
obtained.
CONTRAST:  9mL GADAVIST GADOBUTROL 1 MMOL/ML IV SOLN

[Series 3: ax in&out whole · axial · 6.0mm · 0.74mm/px · 1 of 35 slices shown (1 of 2)]
[im 1/35]
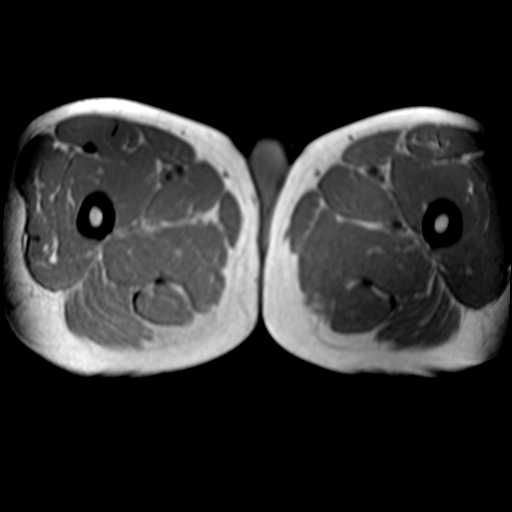

[Series 3: ax in&out whole · axial · 6.0mm · 0.74mm/px · 1 of 35 slices shown (2 of 2)]
[im 1/35]
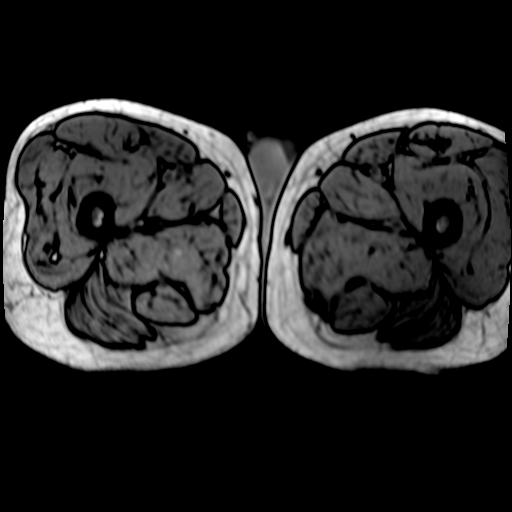

[Series 5: T2 · coronal · 3.0mm · 0.70mm/px · 1 of 35 slices shown (1 of 3)]
[im 1/35]
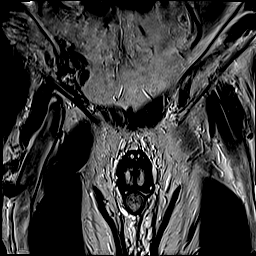

[Series 6: T2 · axial · 3.0mm · 0.56mm/px · 1 of 25 slices shown (2 of 3)]
[im 1/25]
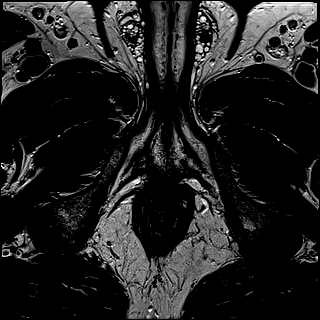

[Series 7: DWI · axial · 3.0mm · 0.86mm/px · 1 of 75 slices shown (1 of 3)]
[im 1/75]
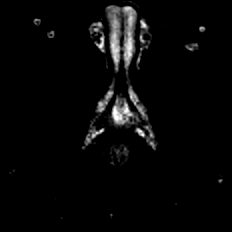

[Series 8: DWI · axial · 3.0mm · 0.86mm/px · 1 of 25 slices shown (2 of 3)]
[im 1/25]
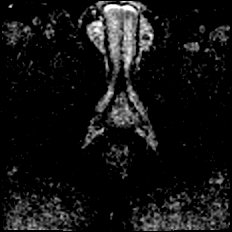

[Series 9: DWI · axial · 3.0mm · 0.86mm/px · 1 of 25 slices shown (3 of 3)]
[im 1/25]
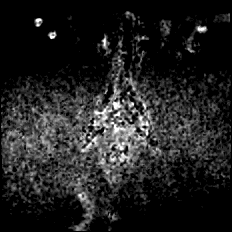

[Series 10: T2 · axial · 1.0mm · 1.04mm/px · 1 of 80 slices shown (3 of 3)]
[im 1/80]
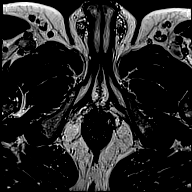

[Series 11: T1 · axial · 3.0mm · 1.15mm/px · 1 of 28 slices shown (1 of 48)]
[im 1/28]
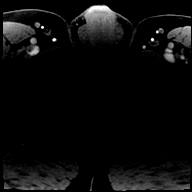

[Series 12: T1 · axial · 3.0mm · 1.15mm/px · 1 of 28 slices shown (2 of 48)]
[im 1/28]
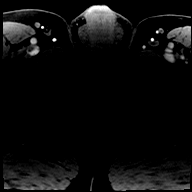

[Series 13: T1 · axial · 3.0mm · 1.15mm/px · 1 of 28 slices shown (3 of 48)]
[im 1/28]
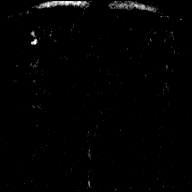

[Series 14: T1 · axial · 3.0mm · 1.15mm/px · 1 of 28 slices shown (4 of 48)]
[im 1/28]
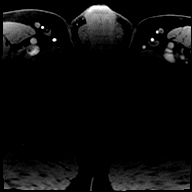

[Series 15: T1 · axial · 3.0mm · 1.15mm/px · 1 of 28 slices shown (5 of 48)]
[im 1/28]
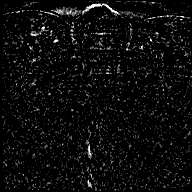

[Series 16: T1 · axial · 3.0mm · 1.15mm/px · 1 of 28 slices shown (6 of 48)]
[im 1/28]
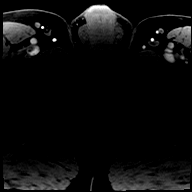

[Series 17: T1 · axial · 3.0mm · 1.15mm/px · 1 of 28 slices shown (7 of 48)]
[im 1/28]
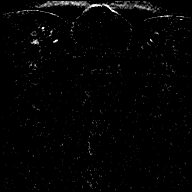

[Series 18: T1 · axial · 3.0mm · 1.15mm/px · 1 of 28 slices shown (8 of 48)]
[im 1/28]
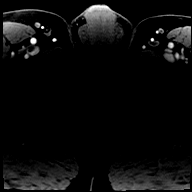

[Series 19: T1 · axial · 3.0mm · 1.15mm/px · 1 of 28 slices shown (9 of 48)]
[im 1/28]
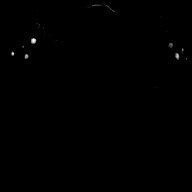

[Series 20: T1 · axial · 3.0mm · 1.15mm/px · 1 of 28 slices shown (10 of 48)]
[im 1/28]
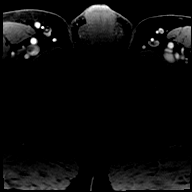

[Series 21: T1 · axial · 3.0mm · 1.15mm/px · 1 of 28 slices shown (11 of 48)]
[im 1/28]
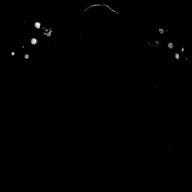

[Series 22: T1 · axial · 3.0mm · 1.15mm/px · 1 of 28 slices shown (12 of 48)]
[im 1/28]
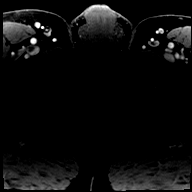

[Series 23: T1 · axial · 3.0mm · 1.15mm/px · 1 of 28 slices shown (13 of 48)]
[im 1/28]
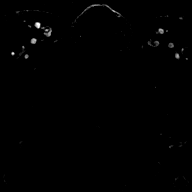

[Series 24: T1 · axial · 3.0mm · 1.15mm/px · 1 of 28 slices shown (14 of 48)]
[im 1/28]
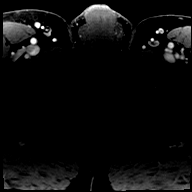

[Series 25: T1 · axial · 3.0mm · 1.15mm/px · 1 of 28 slices shown (15 of 48)]
[im 1/28]
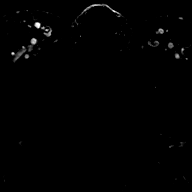

[Series 26: T1 · axial · 3.0mm · 1.15mm/px · 1 of 28 slices shown (16 of 48)]
[im 1/28]
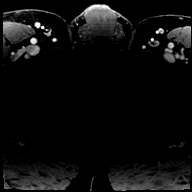

[Series 27: T1 · axial · 3.0mm · 1.15mm/px · 1 of 28 slices shown (17 of 48)]
[im 1/28]
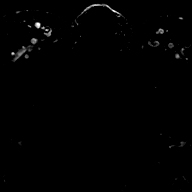

[Series 28: T1 · axial · 3.0mm · 1.15mm/px · 1 of 28 slices shown (18 of 48)]
[im 1/28]
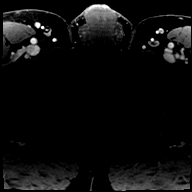

[Series 29: T1 · axial · 3.0mm · 1.15mm/px · 1 of 28 slices shown (19 of 48)]
[im 1/28]
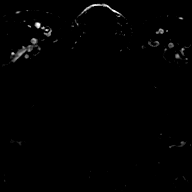

[Series 30: T1 · axial · 3.0mm · 1.15mm/px · 1 of 28 slices shown (20 of 48)]
[im 1/28]
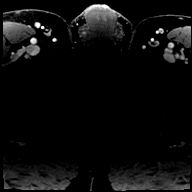

[Series 31: T1 · axial · 3.0mm · 1.15mm/px · 1 of 28 slices shown (21 of 48)]
[im 1/28]
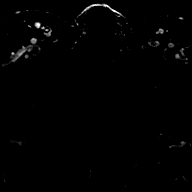

[Series 32: T1 · axial · 3.0mm · 1.15mm/px · 1 of 28 slices shown (22 of 48)]
[im 1/28]
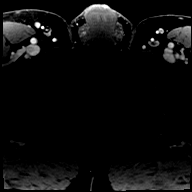

[Series 33: T1 · axial · 3.0mm · 1.15mm/px · 1 of 28 slices shown (23 of 48)]
[im 1/28]
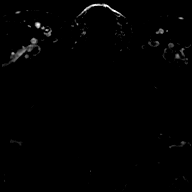

[Series 34: T1 · axial · 3.0mm · 1.15mm/px · 1 of 28 slices shown (24 of 48)]
[im 1/28]
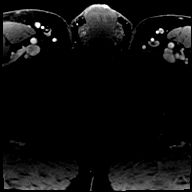

[Series 35: T1 · axial · 3.0mm · 1.15mm/px · 1 of 28 slices shown (25 of 48)]
[im 1/28]
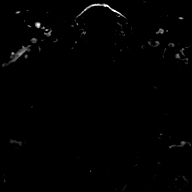

[Series 36: T1 · axial · 3.0mm · 1.15mm/px · 1 of 28 slices shown (26 of 48)]
[im 1/28]
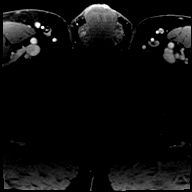

[Series 37: T1 · axial · 3.0mm · 1.15mm/px · 1 of 28 slices shown (27 of 48)]
[im 1/28]
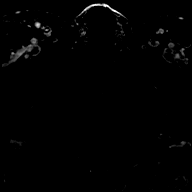

[Series 38: T1 · axial · 3.0mm · 1.15mm/px · 1 of 28 slices shown (28 of 48)]
[im 1/28]
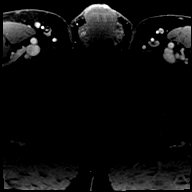

[Series 39: T1 · axial · 3.0mm · 1.15mm/px · 1 of 28 slices shown (29 of 48)]
[im 1/28]
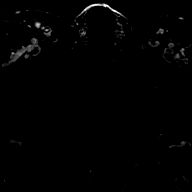

[Series 40: T1 · axial · 3.0mm · 1.15mm/px · 1 of 28 slices shown (30 of 48)]
[im 1/28]
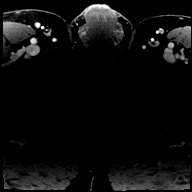

[Series 41: T1 · axial · 3.0mm · 1.15mm/px · 1 of 28 slices shown (31 of 48)]
[im 1/28]
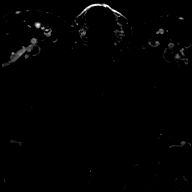

[Series 42: T1 · axial · 3.0mm · 1.15mm/px · 1 of 28 slices shown (32 of 48)]
[im 1/28]
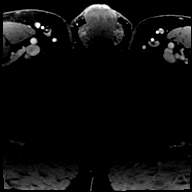

[Series 43: T1 · axial · 3.0mm · 1.15mm/px · 1 of 28 slices shown (33 of 48)]
[im 1/28]
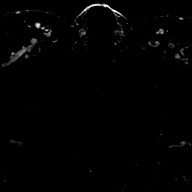

[Series 44: T1 · axial · 3.0mm · 1.15mm/px · 1 of 28 slices shown (34 of 48)]
[im 1/28]
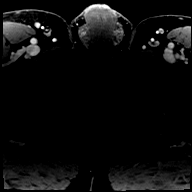

[Series 45: T1 · axial · 3.0mm · 1.15mm/px · 1 of 28 slices shown (35 of 48)]
[im 1/28]
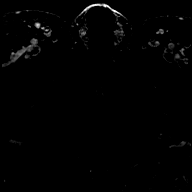

[Series 46: T1 · axial · 3.0mm · 1.15mm/px · 1 of 28 slices shown (36 of 48)]
[im 1/28]
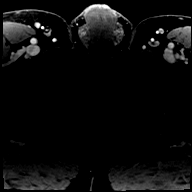

[Series 47: T1 · axial · 3.0mm · 1.15mm/px · 1 of 28 slices shown (37 of 48)]
[im 1/28]
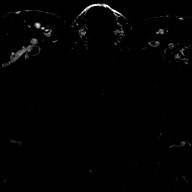

[Series 48: T1 · axial · 3.0mm · 1.15mm/px · 1 of 28 slices shown (38 of 48)]
[im 1/28]
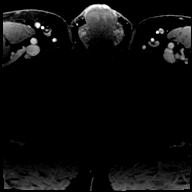

[Series 49: T1 · axial · 3.0mm · 1.15mm/px · 1 of 28 slices shown (39 of 48)]
[im 1/28]
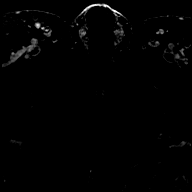

[Series 50: T1 · axial · 3.0mm · 1.15mm/px · 1 of 28 slices shown (40 of 48)]
[im 1/28]
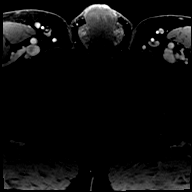

[Series 51: T1 · axial · 3.0mm · 1.15mm/px · 1 of 28 slices shown (41 of 48)]
[im 1/28]
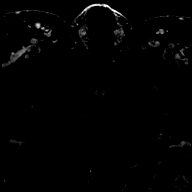

[Series 52: T1 · axial · 3.0mm · 1.15mm/px · 1 of 28 slices shown (42 of 48)]
[im 1/28]
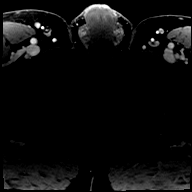

[Series 53: T1 · axial · 3.0mm · 1.15mm/px · 1 of 28 slices shown (43 of 48)]
[im 1/28]
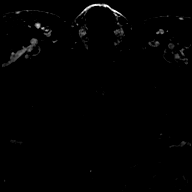

[Series 54: T1 · axial · 3.0mm · 1.15mm/px · 1 of 28 slices shown (44 of 48)]
[im 1/28]
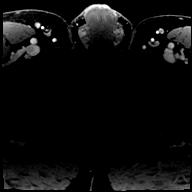

[Series 55: T1 · axial · 3.0mm · 1.15mm/px · 1 of 28 slices shown (45 of 48)]
[im 1/28]
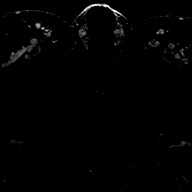

[Series 56: T1 · axial · 3.0mm · 1.15mm/px · 1 of 28 slices shown (46 of 48)]
[im 1/28]
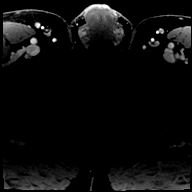

[Series 57: T1 · axial · 3.0mm · 1.15mm/px · 1 of 28 slices shown (47 of 48)]
[im 1/28]
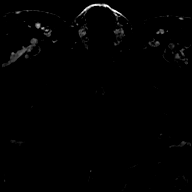

[Series 58: T1 · axial · 3.0mm · 1.15mm/px · 1 of 28 slices shown (48 of 48)]
[im 1/28]
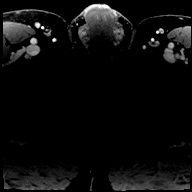

[56 of 56 positions shown; findings below may reference images not displayed]

FINDINGS: Prostate: Since the previous MRI prostate as diminished in size and
shows marked low T2 signal compatible with post treatment changes.
No gross abnormality on T2 or diffusion. There is some asymmetry of
the seminal vesicles that may relate to post treatment changes.

Background low signal is seen throughout the gland and the prostate
bed. Small focus of cystic change 7 x 7 mm in the RIGHT
posterolateral prostate bed.

Small focus of hyperenhancement at the the LEFT prostate apex within
the prostate bed, mildly undulating contour of the prostate in this
area is of uncertain significance given the presence of extensive
post treatment changes of HIFU potentially interval radiotherapy.
This area measures approximately 7 mm (image 16 of series 23,)
corresponding to the LEFT posterolateral gland on image 46 of series
10.

Volume: Approximately 2.7 x 2.0 x 2.0 (volume = 5.7)  Cm

Transcapsular spread: Absent, though of the "capsule" is noted,
capsular distortion could also be related to post treatment changes.

Seminal vesicle involvement: Asymmetry of the seminal vesicles could
also relate to post treatment changes

Neurovascular bundle involvement: Absent

Pelvic adenopathy: Absent

Bone metastasis: On limited assessment structures of the bony pelvis
without suspicious lesion.

Other findings: Post RIGHT inguinal herniorrhaphy. Colonic
diverticulosis.
IMPRESSION: 1. Marked interval decrease in size and low T2 signal within the
prostate bed compatible with post treatment changes. Area appears
open "contracted" compared to prior studies. Correlate with any
interval radiation therapy.
2. Small focus of hyperenhancement at the LEFT prostate apex within
the prostate bed, suspicious for site of disease recurrence within
the prostate bed. "Undulating" contour with mild bulging in this
areas of uncertain significance given the decreased volume in the
gland and possible post treatment changes in the interval since
prior HIFU.

ADDENDUM:
The lesion in the LEFT apical prostate is not assigned PIRADS due to
the presence of suspected disease recurrence in this area. The area
of concern is marked and the remaining prostate segmented in DynaCAD
for the purposes of biopsy. Disease recurrence is likely in this
area in the LEFT apical gland with post treatment changes
surrounding this location.

Capsular distortion is demonstrated as described in the initial
report, perhaps related to post treatment changes. This makes
assessment for "extracapsular disease" difficult.

These results were called by telephone at the time of interpretation
on 07/03/2020 at [DATE] to provider LESYA AUJLA , who verbally
acknowledged these results.

*** End of Addendum ***
FINDINGS: Prostate: Since the previous MRI prostate as diminished in size and
shows marked low T2 signal compatible with post treatment changes.
No gross abnormality on T2 or diffusion. There is some asymmetry of
the seminal vesicles that may relate to post treatment changes.

Background low signal is seen throughout the gland and the prostate
bed. Small focus of cystic change 7 x 7 mm in the RIGHT
posterolateral prostate bed.

Small focus of hyperenhancement at the the LEFT prostate apex within
the prostate bed, mildly undulating contour of the prostate in this
area is of uncertain significance given the presence of extensive
post treatment changes of HIFU potentially interval radiotherapy.
This area measures approximately 7 mm (image 16 of series 23,)
corresponding to the LEFT posterolateral gland on image 46 of series
10.

Volume: Approximately 2.7 x 2.0 x 2.0 (volume = 5.7)  Cm

Transcapsular spread: Absent, though of the "capsule" is noted,
capsular distortion could also be related to post treatment changes.

Seminal vesicle involvement: Asymmetry of the seminal vesicles could
also relate to post treatment changes

Neurovascular bundle involvement: Absent

Pelvic adenopathy: Absent

Bone metastasis: On limited assessment structures of the bony pelvis
without suspicious lesion.

Other findings: Post RIGHT inguinal herniorrhaphy. Colonic
diverticulosis.
IMPRESSION: 1. Marked interval decrease in size and low T2 signal within the
prostate bed compatible with post treatment changes. Area appears
open "contracted" compared to prior studies. Correlate with any
interval radiation therapy.
2. Small focus of hyperenhancement at the LEFT prostate apex within
the prostate bed, suspicious for site of disease recurrence within
the prostate bed. "Undulating" contour with mild bulging in this
areas of uncertain significance given the decreased volume in the
gland and possible post treatment changes in the interval since
prior HIFU.

## 2021-09-09 ENCOUNTER — Other Ambulatory Visit: Payer: Self-pay | Admitting: Urology

## 2021-09-09 DIAGNOSIS — C61 Malignant neoplasm of prostate: Secondary | ICD-10-CM

## 2021-09-09 DIAGNOSIS — R102 Pelvic and perineal pain: Secondary | ICD-10-CM

## 2021-09-11 ENCOUNTER — Other Ambulatory Visit: Payer: Self-pay

## 2021-09-11 ENCOUNTER — Ambulatory Visit
Admission: RE | Admit: 2021-09-11 | Discharge: 2021-09-11 | Disposition: A | Discharge status: Home / Self Care | Payer: Medicare PPO | Source: Ambulatory Visit | Attending: Urology | Admitting: Urology

## 2021-09-11 DIAGNOSIS — R102 Pelvic and perineal pain: Secondary | ICD-10-CM | POA: Diagnosis present

## 2021-09-11 DIAGNOSIS — C61 Malignant neoplasm of prostate: Secondary | ICD-10-CM | POA: Diagnosis present

## 2021-09-11 LAB — POCT I-STAT CREATININE: Creatinine, Ser: 0.9 mg/dL (ref 0.61–1.24)

## 2021-09-11 MED ORDER — IOHEXOL 300 MG/ML  SOLN
100.0000 mL | Freq: Once | INTRAMUSCULAR | Status: AC | PRN
  Administered 2021-09-11: 10:00:00 100 mL via INTRAVENOUS

## 2021-10-02 ENCOUNTER — Ambulatory Visit: Payer: Medicare PPO

## 2021-10-07 ENCOUNTER — Inpatient Hospital Stay
Admission: RE | Admit: 2021-10-07 | Discharge: 2021-10-07 | Disposition: A | Discharge status: Home / Self Care | Payer: Medicare PPO | Source: Ambulatory Visit | Attending: Urology | Admitting: Urology

## 2021-10-07 NOTE — Patient Instructions (Signed)
Your procedure is scheduled on:10/16/2021 Report to the Registration Desk on the 1st floor of the [facility]. To find out your arrival time, please call [phone] between 1PM - 3PM on: 10/15/2021  REMEMBER: Instructions that are not followed completely may result in serious medical risk, up to and including death; or upon the discretion of your surgeon and anesthesiologist your surgery may need to be rescheduled.  Do not eat food after midnight the night before surgery.  No gum chewing, lozengers or hard candies.  You may however, drink CLEAR liquids up to 2 hours before you are scheduled to arrive for your surgery. Do not drink anything within 2 hours of your scheduled arrival time.  Clear liquids include: - water  - apple juice without pulp - gatorade (not RED, PURPLE, OR BLUE) - black coffee or tea (Do NOT add milk or creamers to the coffee or tea) Do NOT drink anything that is not on this list.    TAKE THESE MEDICATIONS THE MORNING OF SURGERY WITH A SIP OF WATER: Amlodipine Finasteride Pyridium Simvastatin    One week prior to surgery: Stop Anti-inflammatories (NSAIDS) such as Advil, Aleve, Ibuprofen, Motrin, Naproxen, Naprosyn and Aspirin based products such as Excedrin, Goodys Powder, BC Powder.  Stop ANY OVER THE COUNTER supplements until after surgery. You may however, continue to take Tylenol if needed for pain up until the day of surgery.  No Alcohol for 24 hours before or after surgery.  No Smoking including e-cigarettes for 24 hours prior to surgery.  No chewable tobacco products for at least 6 hours prior to surgery.  No nicotine patches on the day of surgery.  Do not use any "recreational" drugs for at least a week prior to your surgery.  Please be advised that the combination of cocaine and anesthesia may have negative outcomes, up to and including death. If you test positive for cocaine, your surgery will be cancelled.  On the morning of surgery brush  your teeth with toothpaste and water, you may rinse your mouth with mouthwash if you wish. Do not swallow any toothpaste or mouthwash.   Do not wear jewelry, make-up, hairpins, clips or nail polish.  Do not wear lotions, powders, or perfumes.   Do not shave body from the neck down 48 hours prior to surgery just in case you cut yourself which could leave a site for infection.  Also, freshly shaved skin may become irritated if using the CHG soap.  Contact lenses, hearing aids and dentures may not be worn into surgery.  Do not bring valuables to the hospital. [facility] is not responsible for any missing/lost belongings or valuables.    Notify your doctor if there is any change in your medical condition (cold, fever, infection).  Wear comfortable clothing (specific to your surgery type) to the hospital.  After surgery, you can help prevent lung complications by doing breathing exercises.  Take deep breaths and cough every 1-2 hours. Your doctor may order a device called an Incentive Spirometer to help you take deep breaths.  If you are being admitted to the hospital overnight, leave your suitcase in the car. After surgery it may be brought to your room.  If you are being discharged the day of surgery, you will not be allowed to drive home. You will need a responsible adult (18 years or older) to drive you home and stay with you that night.   If you are taking public transportation, you will need to have a  responsible adult (18 years or older) with you. Please confirm with your physician that it is acceptable to use public transportation.   Please call the [facility] Dept. at [phone] if you have any questions about these instructions.  Surgery Visitation Policy:  Patients undergoing a surgery or procedure may have one family member or support person with them as long as that person is not COVID-19 positive or experiencing its symptoms.  That person may remain in  the waiting area during the procedure and may rotate out with other people.

## 2021-10-07 NOTE — Pre-Procedure Instructions (Signed)
RN attempted to call patient several times today but unable to reach patient . Voice messages left to patient and spouse contact number for patient to call back to complete Pre-admission testing interview. Will attempt again tomorrow.

## 2021-10-08 ENCOUNTER — Other Ambulatory Visit
Admission: RE | Admit: 2021-10-08 | Discharge: 2021-10-08 | Disposition: A | Discharge status: Home / Self Care | Payer: Medicare PPO | Source: Ambulatory Visit | Attending: Urology | Admitting: Urology

## 2021-10-08 ENCOUNTER — Other Ambulatory Visit: Payer: Self-pay

## 2021-10-08 VITALS — Ht 67.5 in | Wt 190.0 lb

## 2021-10-08 DIAGNOSIS — I10 Essential (primary) hypertension: Secondary | ICD-10-CM

## 2021-10-08 NOTE — Patient Instructions (Signed)
Your procedure is scheduled on: Thursday October 16, 2021. Report to Day Surgery inside [facility] 2nd floor.  To find out your arrival time please call [phone] between 1PM - 3PM on Wednesday October 15, 2021.  Remember: Instructions that are not followed completely may result in serious medical risk,  up to and including death, or upon the discretion of your surgeon and anesthesiologist your  surgery may need to be rescheduled.     _X__ 1. Do not eat food after midnight the night before your procedure.                 No chewing gum or hard candies. You may drink clear liquids up to 2 hours                 before you are scheduled to arrive for your surgery- DO not drink clear                 liquids within 2 hours of the start of your surgery.                 Clear Liquids include:  water, apple juice without pulp, clear Gatorade, G2 or                  Gatorade Zero (avoid Red/Purple/Blue), Black Coffee or Tea (Do not add                 anything to coffee or tea).  __X__2.  On the morning of surgery brush your teeth with toothpaste and water, you                may rinse your mouth with mouthwash if you wish.  Do not swallow any toothpaste or mouthwash.     _X__ 3.  No Alcohol for 24 hours before or after surgery.   _X__ 4.  Do Not Smoke or use e-cigarettes For 24 Hours Prior to Your Surgery.                 Do not use any chewable tobacco products for at least 6 hours prior to                 Surgery.  _X__  5.  Do not use any recreational drugs (marijuana, cocaine, heroin, ecstasy, MDMA or other)                For at least one week prior to your surgery.  Combination of these drugs with anesthesia                May have life threatening results.  ____  6.  Bring all medications with you on the day of surgery if instructed.   __X__  7.  Notify your doctor if there is any change in your medical condition      (cold, fever, infections).     Do  not wear jewelry, make-up, hairpins, clips or nail polish. Do not wear lotions, powders, or perfumes. You may wear deodorant. Do not shave 48 hours prior to surgery. Men may shave face and neck. Do not bring valuables to the hospital.    [facility] is not responsible for any belongings or valuables.  Contacts, dentures or bridgework may not be worn into surgery. Leave your suitcase in the car. After surgery it may be brought to your room. For patients admitted to the hospital, discharge time is determined by your treatment team.   Patients  discharged the day of surgery will not be allowed to drive home.   Make arrangements for someone to be with you for the first 24 hours of your Same Day Discharge.   __X__ Take these medicines the morning of surgery with A SIP OF WATER:    1. amLODipine (NORVASC) 5 MG  2. finasteride (PROSCAR) 5 MG   3. simvastatin (ZOCOR) 20 MG   4.  5.  6.  ____ Fleet Enema (as directed)   ____ Use CHG Soap (or wipes) as directed  ____ Use Benzoyl Peroxide Gel as instructed  ____ Use inhalers on the day of surgery  ____ Stop metformin 2 days prior to surgery    ____ Take 1/2 of usual insulin dose the night before surgery. No insulin the morning          of surgery.   ____ Call your PCP, cardiologist, or Pulmonologist if taking Coumadin/Plavix/aspirin and ask when to stop before your surgery.   __X__ One Week prior to surgery- Stop Anti-inflammatories such as Ibuprofen, Aleve, Advil, Motrin, meloxicam (MOBIC), diclofenac, etodolac, ketorolac, Toradol, Daypro, piroxicam, Goody's or BC powders. OK TO USE TYLENOL IF NEEDED   __X__ Stop supplements until after surgery.    ____ Bring C-Pap to the hospital.    If you have any questions regarding your pre-procedure instructions,  Please call Pre-admit Testing at [phone]

## 2021-10-09 ENCOUNTER — Other Ambulatory Visit
Admission: RE | Admit: 2021-10-09 | Discharge: 2021-10-09 | Disposition: A | Discharge status: Home / Self Care | Payer: Medicare PPO | Source: Ambulatory Visit | Attending: Urology | Admitting: Urology

## 2021-10-09 DIAGNOSIS — I10 Essential (primary) hypertension: Secondary | ICD-10-CM | POA: Diagnosis not present

## 2021-10-09 DIAGNOSIS — Z01818 Encounter for other preprocedural examination: Secondary | ICD-10-CM | POA: Insufficient documentation

## 2021-10-09 LAB — BASIC METABOLIC PANEL
Anion gap: 7 (ref 5–15)
BUN: 21 mg/dL (ref 8–23)
CO2: 23 mmol/L (ref 22–32)
Calcium: 8.7 mg/dL — ABNORMAL LOW (ref 8.9–10.3)
Chloride: 109 mmol/L (ref 98–111)
Creatinine, Ser: 0.95 mg/dL (ref 0.61–1.24)
GFR, Estimated: >60 mL/min (ref >60)
Glucose, Bld: 110 mg/dL — ABNORMAL HIGH (ref 70–99)
Potassium: 3.8 mmol/L (ref 3.5–5.1)
Sodium: 139 mmol/L (ref 135–145)

## 2021-10-09 LAB — CBC
HCT: 40.6 % (ref 39.0–52.0)
Hemoglobin: 13.5 g/dL (ref 13.0–17.0)
MCH: 28.4 pg (ref 26.0–34.0)
MCHC: 33.3 g/dL (ref 30.0–36.0)
MCV: 85.5 fL (ref 80.0–100.0)
Platelets: 196 10*3/uL (ref 150–400)
RBC: 4.75 MIL/uL (ref 4.22–5.81)
RDW: 14.5 % (ref 11.5–15.5)
WBC: 2.6 10*3/uL — ABNORMAL LOW (ref 4.0–10.5)
nRBC: 0 % (ref 0.0–0.2)

## 2021-10-14 NOTE — H&P (Signed)
Brett Crawford, Brett Crawford MEDICAL RECORD NO: [id number] ACCOUNT NO: [id number] DATE OF BIRTH: 1951/07/02 FACILITY: [facility] LOCATION: [facility] PHYSICIAN: Otelia Limes. Yves Dill, MD  History and Physical   DATE OF ADMISSION: 10/16/2021  Same day surgery is scheduled for 10/16/2021.  CHIEF COMPLAINT:  Dysuria.  HISTORY OF PRESENT ILLNESS:  The patient is a 71 year old African-American male who has had intermittent dysuria, frequency and sensation of incomplete emptying for the past 3 months.  He was treated with a course of Augmentin in November without relief.  He is currently on tamsulosin, again without relief.  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  He has been using ibuprofen on an as needed basis with minimal  improvement.  CT scan of the abdomen and pelvis 09/11/2021 showed no acute findings and no explanation for his discomfort.  He has a history of prostate cancer and completed external beam radiation therapy 10/19/2020.  Most recent PSA was 0.2 ng/mL  07/08/2021.  He comes in now for cystoscopy.  PAST MEDICAL HISTORY: ALLERGIES:  No drug allergies.  CURRENT MEDICATIONS:  Ibuprofen, amlodipine, simvastatin.  PAST SURGICAL HISTORY:  1.  High intensity focused ultrasound treatment of the prostate, 2008 and 2018. 2.  Colonoscopy.  PAST AND CURRENT MEDICAL CONDITIONS: 1.  Prostate cancer. 2.  Hypercholesterolemia. 3.  Hypertension.  REVIEW OF SYSTEMS:  The patient denies chest pain, shortness of breath, diabetes, stroke or heart disease.  SOCIAL HISTORY:  The patient denied tobacco use.  He consumes 7 alcoholic beverages per week.  FAMILY HISTORY:  Father died of uncertain age of a stroke.  Mother died of uncertain age due to toxic shock syndrome.  There is no family history of urologic disease or urological malignancy.  PHYSICAL EXAMINATION:   VITAL SIGNS:  Height was 5 feet 7 inches, weight 192  pounds, BMI 30. GENERAL:  Well-nourished African-American male in no acute distress. HEENT:  Sclerae were clear.  Pupils are equally round, reactive to light and accommodation.  Extraocular movements were intact. NECK:  No palpable masses. PULMONARY:  Lungs clear to auscultation. CARDIAC:  Regular rhythm and rate. ABDOMEN:  Soft and nontender abdomen. BACK:  No CVA tenderness. GENITOURINARY:  Deferred. RECTAL:  Deferred. NEUROMUSCULAR:  Grossly intact.  IMPRESSION:  1.  Chronic dysuria. 2.  Prostate cancer, status post external beam radiation therapy 10/19/2020.  PLAN: Cystoscopy.   PUS D: 10/10/2021 2:18:53 pm T: 10/10/2021 2:41:00 pm  JOB: [id number]/ [id number]

## 2021-10-16 ENCOUNTER — Ambulatory Visit
Admission: RE | Admit: 2021-10-16 | Discharge: 2021-10-16 | Disposition: A | Discharge status: Home / Self Care | Payer: Medicare PPO | Attending: Urology | Admitting: Urology

## 2021-10-16 ENCOUNTER — Encounter
Admission: RE | Disposition: A | Discharge status: Home / Self Care | Payer: Self-pay | Source: Home / Self Care | Attending: Urology

## 2021-10-16 ENCOUNTER — Encounter: Payer: Self-pay | Admitting: Urology

## 2021-10-16 ENCOUNTER — Ambulatory Visit: Payer: Medicare PPO | Admitting: Registered Nurse

## 2021-10-16 ENCOUNTER — Other Ambulatory Visit: Payer: Self-pay

## 2021-10-16 DIAGNOSIS — N3040 Irradiation cystitis without hematuria: Secondary | ICD-10-CM | POA: Insufficient documentation

## 2021-10-16 DIAGNOSIS — R3914 Feeling of incomplete bladder emptying: Secondary | ICD-10-CM | POA: Insufficient documentation

## 2021-10-16 DIAGNOSIS — Z8546 Personal history of malignant neoplasm of prostate: Secondary | ICD-10-CM | POA: Insufficient documentation

## 2021-10-16 DIAGNOSIS — E7800 Pure hypercholesterolemia, unspecified: Secondary | ICD-10-CM | POA: Diagnosis not present

## 2021-10-16 DIAGNOSIS — R300 Dysuria: Secondary | ICD-10-CM | POA: Diagnosis not present

## 2021-10-16 DIAGNOSIS — R350 Frequency of micturition: Secondary | ICD-10-CM | POA: Insufficient documentation

## 2021-10-16 DIAGNOSIS — I10 Essential (primary) hypertension: Secondary | ICD-10-CM | POA: Diagnosis not present

## 2021-10-16 HISTORY — PX: CYSTOSCOPY: SHX5120

## 2021-10-16 SURGERY — CYSTOSCOPY
Anesthesia: General

## 2021-10-16 MED ORDER — LIDOCAINE HCL (CARDIAC) PF 100 MG/5ML IV SOSY
PREFILLED_SYRINGE | INTRAVENOUS | Status: DC | PRN
  Administered 2021-10-16: 40 mg via INTRAVENOUS
  Administered 2021-10-16: 60 mg via INTRAVENOUS

## 2021-10-16 MED ORDER — OXYCODONE HCL 5 MG PO TABS: 5.0000 mg | ORAL_TABLET | Freq: Once | ORAL | Status: DC | PRN

## 2021-10-16 MED ORDER — FENTANYL CITRATE (PF) 100 MCG/2ML IJ SOLN: 25.0000 ug | INTRAMUSCULAR | Status: DC | PRN

## 2021-10-16 MED ORDER — BELLADONNA ALKALOIDS-OPIUM 16.2-60 MG RE SUPP
RECTAL | Status: AC
  Filled 2021-10-16: qty 1

## 2021-10-16 MED ORDER — GLYCOPYRROLATE 0.2 MG/ML IJ SOLN
INTRAMUSCULAR | Status: AC
  Filled 2021-10-16: qty 1

## 2021-10-16 MED ORDER — SODIUM CHLORIDE 0.9 % IR SOLN
Status: DC | PRN
  Administered 2021-10-16: 625 mL

## 2021-10-16 MED ORDER — KETOROLAC TROMETHAMINE 30 MG/ML IJ SOLN
INTRAMUSCULAR | Status: DC | PRN
Start: 2021-10-16 — End: 2021-10-16
  Administered 2021-10-16: 30 mg via INTRAVENOUS

## 2021-10-16 MED ORDER — ONDANSETRON HCL 4 MG/2ML IJ SOLN
INTRAMUSCULAR | Status: AC
  Filled 2021-10-16: qty 2

## 2021-10-16 MED ORDER — GLYCOPYRROLATE 0.2 MG/ML IJ SOLN
INTRAMUSCULAR | Status: DC | PRN
  Administered 2021-10-16: .1 mg via INTRAVENOUS

## 2021-10-16 MED ORDER — CHLORHEXIDINE GLUCONATE 0.12 % MT SOLN
OROMUCOSAL | Status: AC
  Administered 2021-10-16: 15 mL via OROMUCOSAL
  Filled 2021-10-16: qty 15

## 2021-10-16 MED ORDER — ACETAMINOPHEN 10 MG/ML IV SOLN: 1000.0000 mg | Freq: Once | INTRAVENOUS | Status: DC | PRN

## 2021-10-16 MED ORDER — BELLADONNA ALKALOIDS-OPIUM 16.2-60 MG RE SUPP
RECTAL | Status: DC | PRN
  Administered 2021-10-16: 1 via RECTAL

## 2021-10-16 MED ORDER — CEFAZOLIN SODIUM-DEXTROSE 1-4 GM/50ML-% IV SOLN
INTRAVENOUS | Status: AC
  Filled 2021-10-16: qty 50

## 2021-10-16 MED ORDER — FAMOTIDINE 20 MG PO TABS
ORAL_TABLET | ORAL | Status: AC
  Administered 2021-10-16: 20 mg via ORAL
  Filled 2021-10-16: qty 1

## 2021-10-16 MED ORDER — LACTATED RINGERS IV SOLN
INTRAVENOUS | Status: DC
Start: 2021-10-16 — End: 2021-10-16

## 2021-10-16 MED ORDER — LIDOCAINE HCL (PF) 2 % IJ SOLN
INTRAMUSCULAR | Status: AC
  Filled 2021-10-16: qty 5

## 2021-10-16 MED ORDER — LIDOCAINE HCL URETHRAL/MUCOSAL 2 % EX GEL
CUTANEOUS | Status: DC | PRN
  Administered 2021-10-16: 1 via URETHRAL

## 2021-10-16 MED ORDER — OXYCODONE HCL 5 MG/5ML PO SOLN: 5.0000 mg | Freq: Once | ORAL | Status: DC | PRN

## 2021-10-16 MED ORDER — FENTANYL CITRATE (PF) 100 MCG/2ML IJ SOLN
INTRAMUSCULAR | Status: DC | PRN
  Administered 2021-10-16 (×3): 25 ug via INTRAVENOUS

## 2021-10-16 MED ORDER — ORAL CARE MOUTH RINSE: 15.0000 mL | Freq: Once | OROMUCOSAL | Status: AC

## 2021-10-16 MED ORDER — PROPOFOL 10 MG/ML IV BOLUS
INTRAVENOUS | Status: DC | PRN
Start: 2021-10-16 — End: 2021-10-16
  Administered 2021-10-16: 20 mg via INTRAVENOUS
  Administered 2021-10-16 (×2): 40 mg via INTRAVENOUS

## 2021-10-16 MED ORDER — LEVOFLOXACIN 500 MG PO TABS: 500.0000 mg | ORAL_TABLET | Freq: Every day | ORAL | 1 refills | Status: DC

## 2021-10-16 MED ORDER — ACETAMINOPHEN 10 MG/ML IV SOLN
INTRAVENOUS | Status: DC | PRN
  Administered 2021-10-16: 1000 mg via INTRAVENOUS

## 2021-10-16 MED ORDER — CHLORHEXIDINE GLUCONATE 0.12 % MT SOLN: 15.0000 mL | Freq: Once | OROMUCOSAL | Status: AC

## 2021-10-16 MED ORDER — PROPOFOL 500 MG/50ML IV EMUL
INTRAVENOUS | Status: DC | PRN
  Administered 2021-10-16: 125 ug/kg/min via INTRAVENOUS

## 2021-10-16 MED ORDER — LIDOCAINE HCL URETHRAL/MUCOSAL 2 % EX GEL
CUTANEOUS | Status: AC
  Filled 2021-10-16: qty 10

## 2021-10-16 MED ORDER — CEFAZOLIN SODIUM-DEXTROSE 1-4 GM/50ML-% IV SOLN
1.0000 g | Freq: Three times a day (TID) | INTRAVENOUS | Status: DC
  Administered 2021-10-16: 1 g via INTRAVENOUS

## 2021-10-16 MED ORDER — ACETAMINOPHEN 10 MG/ML IV SOLN
INTRAVENOUS | Status: AC
  Filled 2021-10-16: qty 100

## 2021-10-16 MED ORDER — FAMOTIDINE 20 MG PO TABS: 20.0000 mg | ORAL_TABLET | Freq: Once | ORAL | Status: AC

## 2021-10-16 MED ORDER — PROPOFOL 10 MG/ML IV BOLUS
INTRAVENOUS | Status: AC
  Filled 2021-10-16: qty 20

## 2021-10-16 MED ORDER — FENTANYL CITRATE (PF) 100 MCG/2ML IJ SOLN
INTRAMUSCULAR | Status: AC
  Filled 2021-10-16: qty 2

## 2021-10-16 MED ORDER — ONDANSETRON HCL 4 MG/2ML IJ SOLN: 4.0000 mg | Freq: Once | INTRAMUSCULAR | Status: DC | PRN

## 2021-10-16 MED ORDER — PROPOFOL 1000 MG/100ML IV EMUL
INTRAVENOUS | Status: AC
  Filled 2021-10-16: qty 100

## 2021-10-16 MED ORDER — KETAMINE HCL 50 MG/5ML IJ SOSY
PREFILLED_SYRINGE | INTRAMUSCULAR | Status: AC
  Filled 2021-10-16: qty 5

## 2021-10-16 MED ORDER — DEXAMETHASONE SODIUM PHOSPHATE 10 MG/ML IJ SOLN
INTRAMUSCULAR | Status: AC
  Filled 2021-10-16: qty 1

## 2021-10-16 MED ORDER — PHENYLEPHRINE HCL-NACL 20-0.9 MG/250ML-% IV SOLN
INTRAVENOUS | Status: AC
  Filled 2021-10-16: qty 250

## 2021-10-16 MED ORDER — KETOROLAC TROMETHAMINE 30 MG/ML IJ SOLN
INTRAMUSCULAR | Status: AC
  Filled 2021-10-16: qty 1

## 2021-10-16 SURGICAL SUPPLY — 17 items
BAG DRAIN CYSTO-URO LG1000N (MISCELLANEOUS) ×2 IMPLANT
ELECT REM PT RETURN 9FT ADLT (ELECTROSURGICAL)
ELECTRODE REM PT RTRN 9FT ADLT (ELECTROSURGICAL) ×1 IMPLANT
GAUZE 4X4 16PLY ~~LOC~~+RFID DBL (SPONGE) ×3 IMPLANT
GLOVE SURG ENC MOIS LTX SZ7 (GLOVE) ×2 IMPLANT
GLOVE SURG ENC MOIS LTX SZ7.5 (GLOVE) ×2 IMPLANT
GOWN STRL REUS W/ TWL LRG LVL3 (GOWN DISPOSABLE) ×1 IMPLANT
GOWN STRL REUS W/ TWL XL LVL3 (GOWN DISPOSABLE) ×1 IMPLANT
GOWN STRL REUS W/TWL LRG LVL3 (GOWN DISPOSABLE) ×2
GOWN STRL REUS W/TWL XL LVL3 (GOWN DISPOSABLE) ×2
IV NS IRRIG 3000ML ARTHROMATIC (IV SOLUTION) ×2 IMPLANT
KIT TURNOVER CYSTO (KITS) ×2 IMPLANT
MANIFOLD NEPTUNE II (INSTRUMENTS) ×1 IMPLANT
PACK CYSTO AR (MISCELLANEOUS) ×2 IMPLANT
SET CYSTO W/LG BORE CLAMP LF (SET/KITS/TRAYS/PACK) ×2 IMPLANT
SURGILUBE 2OZ TUBE FLIPTOP (MISCELLANEOUS) ×2 IMPLANT
WATER STERILE IRR 500ML POUR (IV SOLUTION) ×2 IMPLANT

## 2021-10-16 NOTE — Op Note (Signed)
Preoperative diagnosis: Dysuria (R30.0)  Postoperative diagnosis: 1.  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 Yves Dill MD  Anesthesia: General  Indications:70 year old (DOB 07-Nov-2050)African-American male who has had intermittent dysuria, frequency and sensation of incomplete emptying for the past 3 months.  He was treated with a course of Augmentin in November without relief. He is currently on tamsulosin, again without relief.  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  He has been using ibuprofen on an as needed basis with minimal improvement.  CT scan of the abdomen and pelvis 09/11/2021 showed no acute findings and no explanation for his discomfort.  He has a history of prostate cancer and completed external beam radiation therapy 10/19/2020.  Most recent PSA was 0.2 ng/mL 07/08/2021.  He comes in now for cystoscopy. Also see the history and physical. After informed consent the above procedure(s) were requested     Technique and findings: After adequate general anesthesia been obtained patient was placed into dorsal lithotomy position and the perineum was prepped and draped in the usual fashion.  The 21 French cystoscope was coupled to the camera and visually advanced into the bladder.  There were some patchy areas of erythema in the prostatic fossa which was also fibrotic.  The bladder was thoroughly inspected.  No bladder tumors were identified.  There were some areas of erythema of the bladder neck consistent with radiation cystitis.  Both ureteral orifices were identified and had clear efflux.  The bladder was then filled to capacity at 70 cm of water pressure.  The bladder was drained and capacity of 650 cc noted.  No glomerulations or bladder ulcers were identified  At this point  the cystoscope was removed.  10 cc of viscous Xylocaine was instilled within the bladder and urethra.  A B&O suppository was placed.  Digital rectal exam did not reveal any rectal masses and prostate was not palpable.  The procedure was then terminated and patient transferred to the recovery room in stable condition.

## 2021-10-16 NOTE — Transfer of Care (Signed)
Immediate Anesthesia Transfer of Care Note  Patient: Brett Crawford  Procedure(s) Performed: CYSTOSCOPY  Patient Location: PACU  Anesthesia Type:General  Level of Consciousness: oriented, drowsy and patient cooperative  Airway & Oxygen Therapy: Patient Spontanous Breathing  Post-op Assessment: Report given to RN and Post -op Vital signs reviewed and stable  Post vital signs: Reviewed and stable  Last Vitals:  Vitals Value Taken Time  BP 116/78 10/16/21 0801  Temp    Pulse 92 10/16/21 0803  Resp 15 10/16/21 0803  SpO2 95 % 10/16/21 0803  Vitals shown include unvalidated device data.  Last Pain:  Vitals:   10/16/21 0611  PainSc: 0-No pain         Complications: No notable events documented.

## 2021-10-16 NOTE — H&P (Signed)
Date of Initial H&P: 10/10/21  History reviewed, patient examined, no change in status, stable for surgery.

## 2021-10-16 NOTE — Discharge Instructions (Addendum)
Hydrodistention of the Bladder, Care After This sheet gives you information about how to care for yourself after your procedure. Your health care provider may also give you more specific instructions. If you have problems or questions, contact your health care provider. What can I expect after the procedure? After the procedure, it is common to have: Soreness and mild discomfort in your lower abdomen. Mild pain when you urinate. Pain should stop within a few minutes after you urinate. This may last for up to a week. A small amount of blood in your urine. Follow these instructions at home: Medicines Take over-the-counter and prescription medicines only as told by your health care provider. Do not drive for 24 hours if you were given a sedative during your procedure. Do not drive or use heavy machinery while taking prescription pain medicine. If you were prescribed an antibiotic medicine, take it as told by your health care provider. Do not stop taking the antibiotic even if you start to feel better. Lifestyle Do not use any products that contain nicotine or tobacco, such as cigarettes, e-cigarettes, and chewing tobacco. If you need help quitting, ask your health care provider. Activity Return to your normal activities as told by your health care provider. Ask your health care provider what activities are safe for you. Do not lift anything that is heavier than 10 lb (4.5 kg), or the limit that you are told, until your health care provider says that it is safe. Eating and drinking Follow instructions from your health care provider about eating or drinking restrictions. Drink enough fluid to keep your urine pale yellow. General instructions If a sample was removed for testing (biopsy), it is your responsibility to get your test results. Ask your health care provider or the department doing the test when your results will be ready. Keep all follow-up visits as told by your health care provider.  This is important. Contact a health care provider if you have: Blood clots in your urine. Pus in your urine. Pain that gets worse or does not get better with medicine, especially pain when you urinate. Difficulty urinating. Nausea or you vomit for more than 2 days after the procedure. A fever. Get help right away if you: Have severe pain in your abdomen. Cannot urinate. Have chest pain or difficulty breathing. Develop swelling or pain (or both) in your lower legs. Summary After this procedure, it is common to have soreness in your lower abdomen, mild pain when you urinate, and a small amount of blood in your urine. Return to your normal activities as told by your health care provider. Contact a health care provider if you have pain that gets worse, have a fever, see blood clots in your urine, or have difficulty urinating. Get help right away if you have severe pain in your abdomen, cannot urinate, have difficulty breathing, or develop swelling and pain in the legs. This information is not intended to replace advice given to you by your health care provider. Make sure you discuss any questions you have with your health care provider. Document Revised: 12/29/2018 Document Reviewed: 03/17/2018 Elsevier Patient Education  2022 [employer]   The drugs that you were given will stay in your system until tomorrow so for the next 24 hours you should not:  Drive an automobile Make any legal decisions Drink any alcoholic beverage   You may resume regular meals tomorrow.  Today it is better to start with liquids and gradually  work up to [employer].  You may eat anything you prefer, but it is better to start with liquids, then soup and crackers, and gradually work up to solid foods.   Please notify your doctor immediately if you have any unusual bleeding, trouble breathing, redness and pain at the surgery site, drainage, fever, or pain not  relieved by medication.    Additional Instructions:        Please contact your physician with any problems or Same Day Surgery at [phone], Monday through Friday 6 am to 4 pm, or [place] [facility] number at [phone].

## 2021-10-16 NOTE — Anesthesia Postprocedure Evaluation (Signed)
Anesthesia Post Note  Patient: Brett Crawford  Procedure(s) Performed: CYSTOSCOPY  Patient location during evaluation: PACU Anesthesia Type: General Level of consciousness: awake and alert Pain management: pain level controlled Vital Signs Assessment: post-procedure vital signs reviewed and stable Respiratory status: spontaneous breathing, nonlabored ventilation, respiratory function stable and patient connected to nasal cannula oxygen Cardiovascular status: blood pressure returned to baseline and stable Postop Assessment: no apparent nausea or vomiting Anesthetic complications: no   No notable events documented.   Last Vitals:  Vitals:   10/16/21 0815 10/16/21 0831  BP: 133/85 (!) 142/82  Pulse: 78 62  Resp: 20 14  Temp: (!) 36.4 C   SpO2: 98% 99%    Last Pain:  Vitals:   10/16/21 0815  PainSc: 0-No pain                 Arita Miss

## 2021-10-16 NOTE — Anesthesia Preprocedure Evaluation (Signed)
Anesthesia Evaluation  Patient identified by MRN, date of birth, ID band Patient awake    Reviewed: Allergy & Precautions, NPO status , Patient's Chart, lab work & pertinent test results  History of Anesthesia Complications Negative for: history of anesthetic complications  Airway Mallampati: III  TM Distance: >3 FB Neck ROM: Full    Dental  (+) Teeth Intact   Pulmonary neg pulmonary ROS, neg sleep apnea, neg COPD, Patient abstained from smoking.Not current smoker,    Pulmonary exam normal breath sounds clear to auscultation       Cardiovascular Exercise Tolerance: Good METShypertension, Pt. on medications (-) CAD and (-) Past MI (-) dysrhythmias  Rhythm:Regular Rate:Normal - Systolic murmurs    Neuro/Psych negative neurological ROS  negative psych ROS   GI/Hepatic neg GERD  ,(+)     (-) substance abuse  ,   Endo/Other  neg diabetes  Renal/GU negative Renal ROS     Musculoskeletal   Abdominal   Peds  Hematology   Anesthesia Other Findings Past Medical History: No date: Atypical chest pain 2008: Cancer ([place])     Comment:  prostate No date: Hyperlipidemia No date: Hypertension No date: Murmur     Comment:  ASYMPTOMATIC PER PT No date: T wave inversion on electrocardiogram     Comment:  anterolateral  Reproductive/Obstetrics                             Anesthesia Physical Anesthesia Plan  ASA: 2  Anesthesia Plan: General   Post-op Pain Management: Minimal or no pain anticipated   Induction: Intravenous  PONV Risk Score and Plan: 2 and Propofol infusion, TIVA and Ondansetron  Airway Management Planned: Nasal Cannula  Additional Equipment: None  Intra-op Plan:   Post-operative Plan:   Informed Consent: I have reviewed the patients History and Physical, chart, labs and discussed the procedure including the risks, benefits and alternatives for the proposed anesthesia with  the patient or authorized representative who has indicated his/her understanding and acceptance.     Dental advisory given  Plan Discussed with: CRNA and Surgeon  Anesthesia Plan Comments: (Discussed risks of anesthesia with patient, including possibility of difficulty with spontaneous ventilation under anesthesia necessitating airway intervention, PONV, and rare risks such as cardiac or respiratory or neurological events, and allergic reactions. Discussed the role of CRNA in patient's perioperative care. Patient understands.)        Anesthesia Quick Evaluation

## 2021-12-04 IMAGING — CR DG CHEST 2V
1 series · 2 of 2 positions shown · non-contrast
Comparison: Chest radiograph October 04, 2018; PET-CT November 09, 2019

CLINICAL DATA: Chest pain.  Prostate carcinoma

EXAM:
CHEST - 2 VIEW

[Series 1: dg chest 2 view · 0.14mm/px · 2 of 2 slices shown]
[im 1/2]
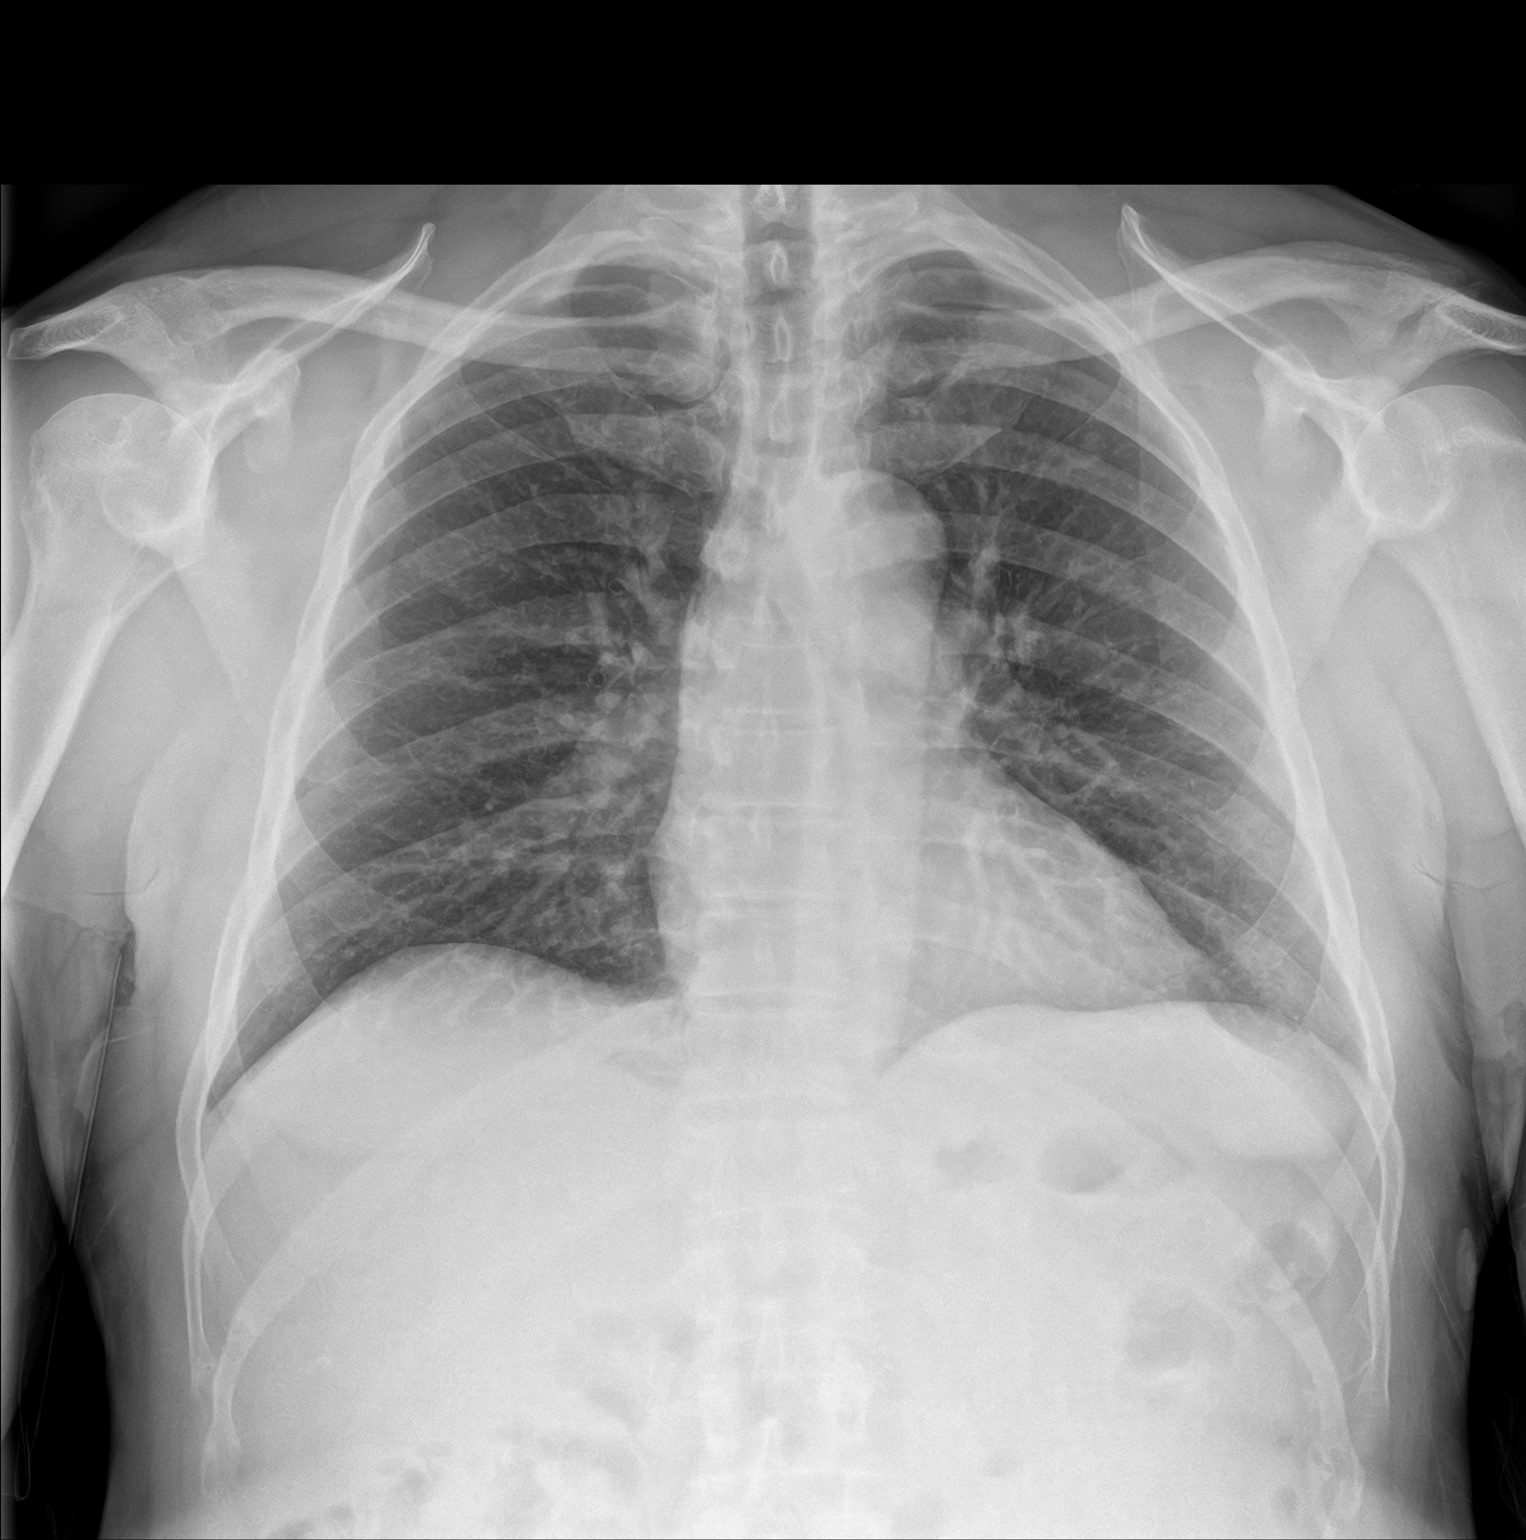
[im 2/2]
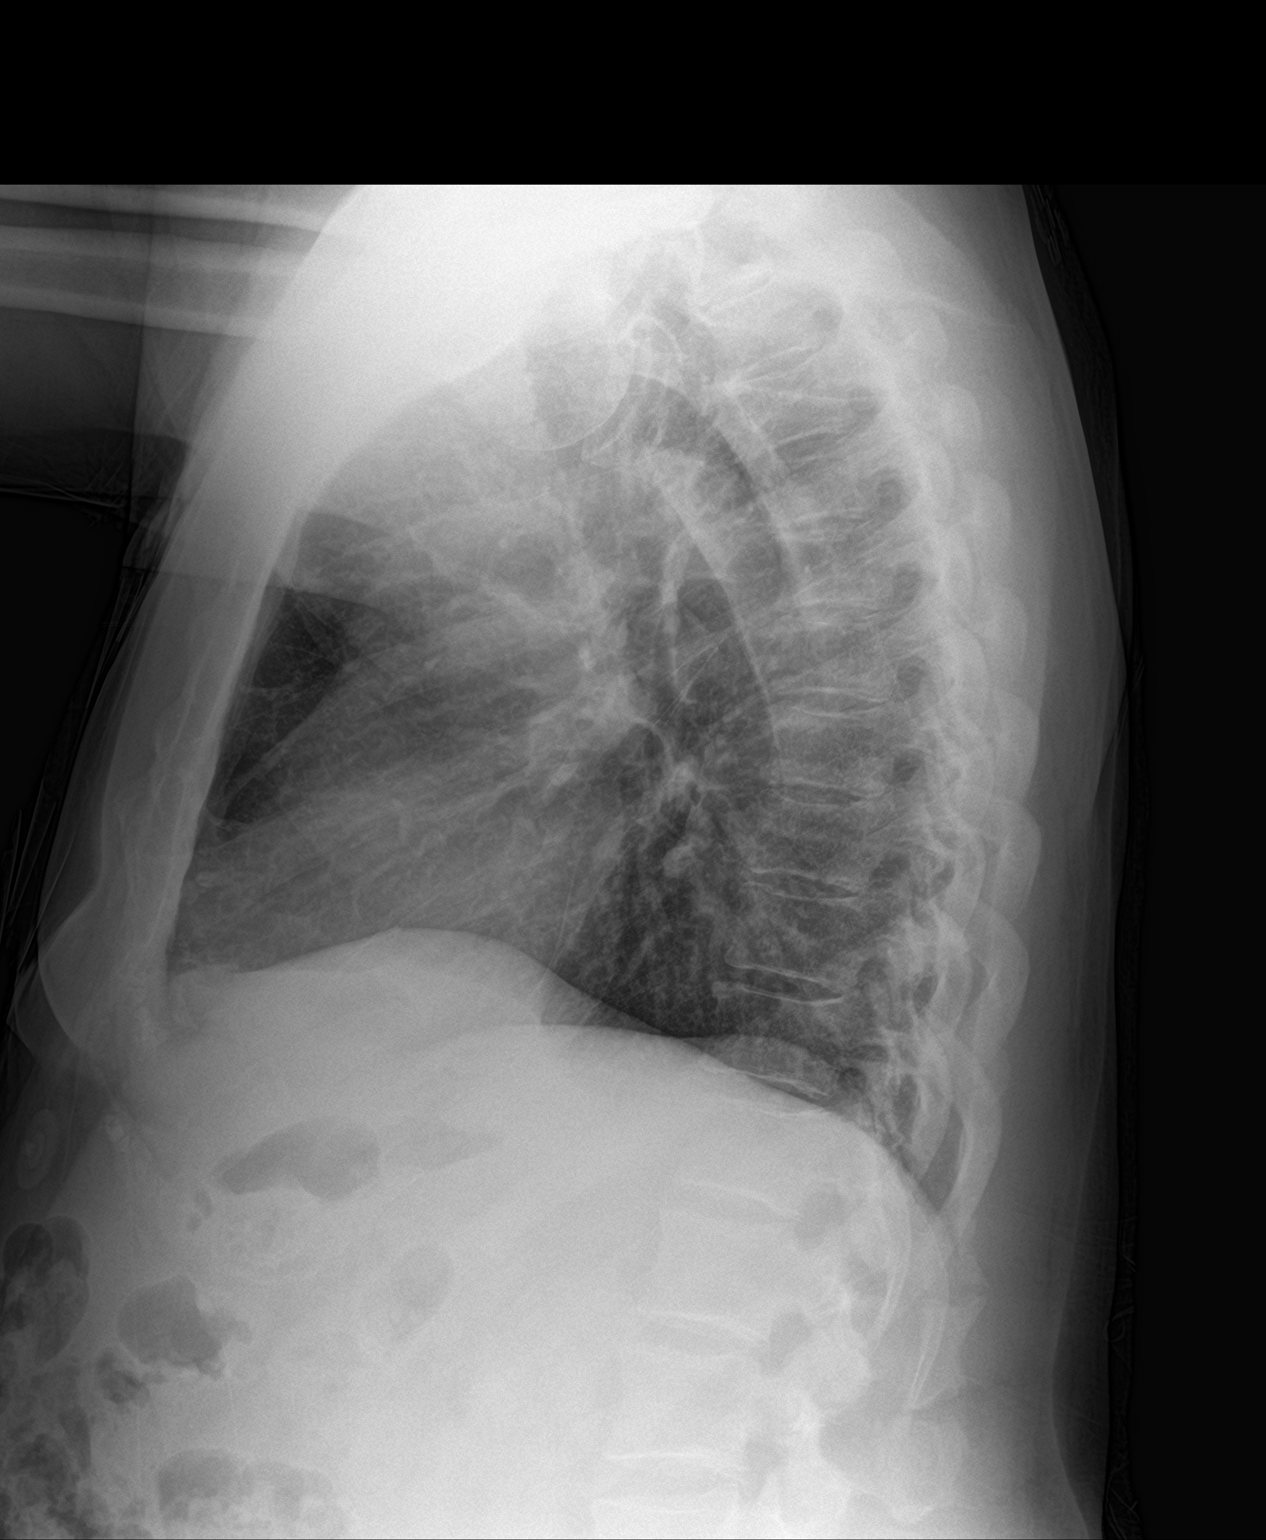

[2 of 2 positions shown; findings below may reference images not displayed]

FINDINGS: Lungs are clear. Heart size and pulmonary vascularity are normal. No
adenopathy. No bone lesions. No pneumothorax.
IMPRESSION: No neoplastic focus evident.  Lungs clear.  No adenopathy.

## 2022-11-17 ENCOUNTER — Encounter: Payer: Self-pay | Admitting: Physical Medicine and Rehabilitation

## 2022-12-17 ENCOUNTER — Other Ambulatory Visit: Payer: Self-pay

## 2022-12-21 ENCOUNTER — Encounter: Payer: Medicare PPO | Attending: Physical Medicine and Rehabilitation | Admitting: Physical Medicine and Rehabilitation

## 2022-12-21 ENCOUNTER — Encounter: Payer: Self-pay | Admitting: Physical Medicine and Rehabilitation

## 2022-12-21 VITALS — BP 130/79 | HR 68 | Ht 67.5 in | Wt 200.0 lb

## 2022-12-21 DIAGNOSIS — R079 Chest pain, unspecified: Secondary | ICD-10-CM | POA: Diagnosis not present

## 2022-12-21 DIAGNOSIS — M549 Dorsalgia, unspecified: Secondary | ICD-10-CM | POA: Insufficient documentation

## 2022-12-21 DIAGNOSIS — R1011 Right upper quadrant pain: Secondary | ICD-10-CM | POA: Diagnosis not present

## 2022-12-21 MED ORDER — LIDOCAINE HCL 1 % IJ SOLN
3.0000 mL | Freq: Once | INTRAMUSCULAR | Status: AC
  Administered 2022-12-21: 3 mL via INTRADERMAL

## 2022-12-21 MED ORDER — TRIAMCINOLONE ACETONIDE 40 MG/ML IJ SUSP
5.0000 mg | Freq: Once | INTRAMUSCULAR | Status: AC
  Administered 2022-12-21: 5.2 mg via INTRAMUSCULAR

## 2022-12-21 NOTE — Progress Notes (Signed)
Subjective:    Patient ID: Brett Crawford, male    DOB: [birth year], 72 y.o.   MRN: [id number]  HPI  Brett Crawford is a 72 y.o. year old male  who  has a past medical history of Atypical chest pain, Cancer (2008), Hyperlipidemia, Hypertension, Murmur, and T wave inversion on electrocardiogram.   They are presenting to PM&R clinic as a new patient for treatment of RUQ abdominal pain . They were referred by   Kelton Pillar, MD   Per their last note:  "[name] presents to the [facility] at the request of his PCP Cheron Every, NP) for chief complaint of intermittent RUQ abdominal pain/discomfort x 5 months. He reports the pain has been intermittent over the past 5 months and appears to be focal right underneath his rib cage mid-scapular line. He describes the pain as throbbing and aching. It does not radiate. It is more of a discomfort than a pain. He notices the pain when he is repositioning himself in bed at night or arising from a seated position. It is definitely exacerbated by movement. He reports no association with food. He denies any associated GI symptoms such as nausea, vomiting, esophageal dysphagia, odynophagia, early satiety, hoarseness, or epigastric abdominal pain. Ibuprofen has provided temporary relief. He denies any recent changes in his bowel habits. He denies any diarrhea, constipation, hematochezia, or melena. He did have surveillance colonoscopy 08/2022 which showed pandiverticulosis and otherwise normal examined colon. He is wanting to get this addressed because he is going on a trip to [place] in May. Appetite and diet are stable without any unintentional weight loss.  ...  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 No clear associated GI symptoms and I have low suspicion for GI etiology.  - Advise referral to physical medicine for consideration of trigger point injection - Oriented to diagnosis of diastasis  recti. Continue to observe. "  Patient endorses Hx consistent with the above. He indicates that the pain radiates long the lower part of his right ribcage along approximately 10th rib inferior margin to his back and to his abdomen. Pain is worse with movements, twisting, such as golfing. He denies any acute trauma at time of onset. He denies any interventions for it thus far. Referral is for TPI, which patient is agreeable to proceeding with today.    Pain Inventory Average Pain 6 Pain Right Now 6 My pain is constant and pulsate discomfort  In the last 24 hours, has pain interfered with the following? General activity 7 Relation with others 6 Enjoyment of life 8 What TIME of day is your pain at its worst? morning , daytime, evening, and night Sleep (in general) Fair  Pain is worse with: sitting and inactivity Pain improves with:  nothing Relief from Meds: 4  how many minutes can you walk? No problem walking  retired  No problems in this area  Any changes since last visit?  no  Any changes since last visit?  no    Family History  Problem Relation Age of Onset   Stroke Father    Social History   Socioeconomic History   Marital status: Married    Spouse name: Not on file   Number of children: Not on file   Years of education: Not on file   Highest education level: Not on file  Occupational History   Not on file  Tobacco Use   Smoking status: Never  Smokeless tobacco: Never  Vaping Use   Vaping Use: Never used  Substance and Sexual Activity   Alcohol use: Yes    Comment: BEER OR WINE DAILY   Drug use: No   Sexual activity: Not on file  Other Topics Concern   Not on file  Social History Narrative   Not on file   Social Determinants of Health   Financial Resource Strain: Not on file  Food Insecurity: Not on file  Transportation Needs: Not on file  Physical Activity: Not on file  Stress: Not on file  Social Connections: Not on file   Past Surgical  History:  Procedure Laterality Date   COLONOSCOPY  2011 ?   CYSTOSCOPY N/A 10/16/2021   Procedure: CYSTOSCOPY;  Surgeon: Orson Ape, MD;  Location: [facility];  Service: Urology;  Laterality: N/A;   EYE SURGERY Right    HERNIA REPAIR  11/18/2011   ventral / Dr Lemar Livings   high intesnty focused ultrasound ( HIFU)  2008 and 2018   INGUINAL HERNIA REPAIR Right 05/25/2017   Indirect hernia repaired with medium Ultra Pro mesh  Surgeon: Earline Mayotte, MD;  Location: [facility];  Service: General;  Laterality: Right;   PROSTATE BIOPSY N/A 11/03/2016   Procedure: PROSTATE BIOPSY;  Surgeon: Orson Ape, MD;  Location: [facility];  Service: Urology;  Laterality: N/A;   PROSTATE BIOPSY N/A 07/04/2020   Procedure: PROSTATE BIOPSY Addison Bailey;  Surgeon: Orson Ape, MD;  Location: [facility];  Service: Urology;  Laterality: N/A;   UMBILICAL HERNIA REPAIR  04/2011   Dr Lemar Livings   Past Medical History:  Diagnosis Date   Atypical chest pain    Cancer 2008   prostate   Hyperlipidemia    Hypertension    Murmur    ASYMPTOMATIC PER PT   T wave inversion on electrocardiogram    anterolateral   BP 130/79   Pulse 68   Ht 5' 7.5" (1.715 m)   Wt 200 lb (90.7 kg)   SpO2 96%   BMI 30.86 kg/m   Opioid Risk Score:   Fall Risk Score:  `1  Depression screen [facility] 2/9     12/21/2022   10:57 AM  Depression screen PHQ 2/9  Decreased Interest 0  Down, Depressed, Hopeless 0  PHQ - 2 Score 0  Altered sleeping 0  Tired, decreased energy 0  Change in appetite 0  Feeling bad or failure about yourself  0  Trouble concentrating 0  Moving slowly or fidgety/restless 0  Suicidal thoughts 0  PHQ-9 Score 0    Review of Systems  Cardiovascular:  Positive for chest pain.  Musculoskeletal:  Positive for back pain.  All other systems reviewed and are negative.      Objective:   Physical Exam   PE: Constitution: Appropriate appearance for age. No apparent distress  +Obese Resp: No respiratory  distress. No accessory muscle usage.  Cardio: Well perfused appearance. No peripheral edema. Abdomen: Nondistended. Nontender. Mild diastasis recti with abdominal crunch; does not exacerbate symptoms.   Psych: Appropriate mood and affect. Neuro: AAOx4. No apparent cognitive deficits  MSK: No apparent deformity.   +TTP R lumbar parsaspinals, RUQ just inferior to tip of 10th rib, tip of 12th rib  Skin: No apparent lesions or rashes.        Assessment & Plan:   Fritz Licea is a 72 y.o. year old male  who  has a past medical history of Atypical chest pain, Cancer (2008), Hyperlipidemia,  Hypertension, Murmur, and T wave inversion on electrocardiogram.   They are presenting to PM&R clinic as a new patient for treatment of RUQ abdominal pain . They were referred by Kelton Pillar, MD . Based on their presentation, differential includes anterior cutaneous nerve entrapment vs. Intercostal neuralgia vs. muscle spasm. 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    PROCEDURE:  Right trigger point injections Diagnosis:    ICD-10-CM   1. Right upper quadrant abdominal pain  R10.11 lidocaine (XYLOCAINE) 1 % (with pres) injection 3 mL    triamcinolone acetonide (KENALOG-40) injection 5.2 mg      Goals with treatment: [ x ] Decrease pain [  ] Improve Active / Passive ROM [ x ] Improve ADLs [  ] Improve functional mobility  MEDICATION:  [ x ] Kenalog 40 mg/mL  [ X ] Lidocaine 1%    CONSENT: Obtained in writing per policy. Consent uploaded to chart.  Benefits discussed.  Risks discussed included, but were not limited to, pain and discomfort, bleeding, bruising, allergic reaction, infection. All questions answered to patient/family member/guardian/ caregiver satisfaction. They would like to proceed with procedure. There are no noted contraindications to procedure.  PROCEDURE Time out was preformed No heat sources No  antibiotics  The patient was explained about both the benefits and risks of a Right  trigger point injections. After the patient acknowledged an understanding of the risks and benefits, the patient agreed to proceed. The area was first marked and then prepped in an aseptic fashion with betadine / alcohol. A 27 g, 1.5 inch needle was directed via a direct approach into the painful tenderpoint areas R lumbar paraspinal, tip of the 12th rib, and under ultrasound guidance into the belly of the RUQ rectus abdominus muscle into the most painful area.The injection was completed with Kenalog 40 mg/ml 0.2 cc mixed with 3 cc of 1% lidocaine after no blood was aspirated on pull back.  The pt tolerated the procedure well.   PLAN: - Resume Usual Activities. Notify Physician of any unusual bleeding, erythema or concern for side effects as reviewed above. - Apply ice prn for pain - Tylenol prn for pain - Follow up with GI for further assessment and workup of your abdominal pain if no improvement after TPI  Patient/Care Giver was ready to learn without apparent learning barriers. Education was provided on diagnosis, treatment options/plan according to patient's preferred learning style. Patient/Care Giver verbalized understanding and agreement with the above plan.   Angelina Sheriff, DO 12/25/2022

## 2022-12-21 NOTE — Patient Instructions (Signed)
-   Resume Usual Activities. Notify Physician of any unusual bleeding, erythema or concern for side effects as reviewed above. - Apply ice prn for pain - Tylenol prn for pain - Follow up with gastroenterology
